# Patient Record
Sex: Female | Born: 1945
Health system: Southern US, Community
[De-identification: ages and names within clinical notes are randomized; demographics above are authoritative.]

## PROBLEM LIST (undated history)

## (undated) DIAGNOSIS — IMO0002 Reserved for concepts with insufficient information to code with codable children: Secondary | ICD-10-CM

## (undated) DIAGNOSIS — K219 Gastro-esophageal reflux disease without esophagitis: Secondary | ICD-10-CM

## (undated) DIAGNOSIS — Z973 Presence of spectacles and contact lenses: Secondary | ICD-10-CM

## (undated) DIAGNOSIS — Z8719 Personal history of other diseases of the digestive system: Secondary | ICD-10-CM

## (undated) DIAGNOSIS — E785 Hyperlipidemia, unspecified: Secondary | ICD-10-CM

## (undated) HISTORY — PX: DILATION AND CURETTAGE OF UTERUS: SHX78

## (undated) HISTORY — PX: TUBAL LIGATION: SHX77

## (undated) HISTORY — PX: WISDOM TOOTH EXTRACTION: SHX21

## (undated) HISTORY — PX: COLONOSCOPY W/ POLYPECTOMY: SHX1380

---

## 1998-06-06 ENCOUNTER — Encounter: Admission: RE | Admit: 1998-06-06 | Discharge: 1998-06-06 | Payer: Self-pay | Admitting: *Deleted

## 1999-11-18 ENCOUNTER — Encounter: Payer: Self-pay | Admitting: *Deleted

## 1999-11-18 ENCOUNTER — Inpatient Hospital Stay (HOSPITAL_COMMUNITY): Admission: EM | Admit: 1999-11-18 | Discharge: 1999-11-19 | Payer: Self-pay | Admitting: *Deleted

## 1999-11-19 ENCOUNTER — Encounter: Payer: Self-pay | Admitting: Cardiology

## 2000-01-01 ENCOUNTER — Encounter: Admission: RE | Admit: 2000-01-01 | Discharge: 2000-01-01 | Payer: Self-pay | Admitting: Obstetrics and Gynecology

## 2000-01-01 ENCOUNTER — Encounter: Payer: Self-pay | Admitting: Obstetrics and Gynecology

## 2000-01-19 ENCOUNTER — Other Ambulatory Visit: Admission: RE | Admit: 2000-01-19 | Discharge: 2000-01-19 | Payer: Self-pay | Admitting: Obstetrics and Gynecology

## 2000-01-19 ENCOUNTER — Other Ambulatory Visit: Admission: RE | Admit: 2000-01-19 | Discharge: 2000-01-19 | Payer: Self-pay | Admitting: *Deleted

## 2000-11-14 ENCOUNTER — Emergency Department (HOSPITAL_COMMUNITY): Admission: EM | Admit: 2000-11-14 | Discharge: 2000-11-14 | Payer: Self-pay | Admitting: Emergency Medicine

## 2001-02-04 ENCOUNTER — Encounter: Payer: Self-pay | Admitting: Neurology

## 2001-02-04 ENCOUNTER — Encounter: Payer: Self-pay | Admitting: Emergency Medicine

## 2001-02-04 ENCOUNTER — Emergency Department (HOSPITAL_COMMUNITY): Admission: EM | Admit: 2001-02-04 | Discharge: 2001-02-04 | Payer: Self-pay | Admitting: Emergency Medicine

## 2001-06-27 ENCOUNTER — Other Ambulatory Visit: Admission: RE | Admit: 2001-06-27 | Discharge: 2001-06-27 | Payer: Self-pay | Admitting: *Deleted

## 2001-07-06 HISTORY — PX: CARDIAC CATHETERIZATION: SHX172

## 2001-07-25 ENCOUNTER — Encounter: Admission: RE | Admit: 2001-07-25 | Discharge: 2001-07-25 | Payer: Self-pay | Admitting: *Deleted

## 2001-07-25 ENCOUNTER — Encounter: Payer: Self-pay | Admitting: *Deleted

## 2001-08-25 ENCOUNTER — Encounter: Admission: RE | Admit: 2001-08-25 | Discharge: 2001-08-25 | Payer: Self-pay | Admitting: Internal Medicine

## 2001-08-25 ENCOUNTER — Encounter: Payer: Self-pay | Admitting: Internal Medicine

## 2001-10-14 ENCOUNTER — Ambulatory Visit (HOSPITAL_COMMUNITY): Admission: RE | Admit: 2001-10-14 | Discharge: 2001-10-14 | Payer: Self-pay | Admitting: Gastroenterology

## 2001-10-19 ENCOUNTER — Ambulatory Visit (HOSPITAL_COMMUNITY): Admission: RE | Admit: 2001-10-19 | Discharge: 2001-10-19 | Payer: Self-pay | Admitting: Gastroenterology

## 2002-03-14 ENCOUNTER — Encounter: Payer: Self-pay | Admitting: Emergency Medicine

## 2002-03-14 ENCOUNTER — Emergency Department (HOSPITAL_COMMUNITY): Admission: EM | Admit: 2002-03-14 | Discharge: 2002-03-14 | Payer: Self-pay | Admitting: Emergency Medicine

## 2002-03-15 ENCOUNTER — Inpatient Hospital Stay (HOSPITAL_COMMUNITY): Admission: AD | Admit: 2002-03-15 | Discharge: 2002-03-17 | Payer: Self-pay | Admitting: Cardiology

## 2002-03-15 ENCOUNTER — Encounter: Payer: Self-pay | Admitting: Cardiology

## 2002-08-02 ENCOUNTER — Other Ambulatory Visit: Admission: RE | Admit: 2002-08-02 | Discharge: 2002-08-02 | Payer: Self-pay | Admitting: *Deleted

## 2002-08-29 ENCOUNTER — Encounter: Admission: RE | Admit: 2002-08-29 | Discharge: 2002-08-29 | Payer: Self-pay | Admitting: Internal Medicine

## 2002-08-29 ENCOUNTER — Encounter: Payer: Self-pay | Admitting: Internal Medicine

## 2002-09-29 ENCOUNTER — Emergency Department (HOSPITAL_COMMUNITY): Admission: AD | Admit: 2002-09-29 | Discharge: 2002-09-29 | Payer: Self-pay | Admitting: Emergency Medicine

## 2002-09-29 ENCOUNTER — Encounter: Payer: Self-pay | Admitting: Emergency Medicine

## 2005-02-19 ENCOUNTER — Encounter: Admission: RE | Admit: 2005-02-19 | Discharge: 2005-02-19 | Payer: Self-pay | Admitting: Internal Medicine

## 2006-07-06 DIAGNOSIS — Z8719 Personal history of other diseases of the digestive system: Secondary | ICD-10-CM

## 2006-07-06 HISTORY — DX: Personal history of other diseases of the digestive system: Z87.19

## 2006-12-25 ENCOUNTER — Emergency Department (HOSPITAL_COMMUNITY): Admission: EM | Admit: 2006-12-25 | Discharge: 2006-12-25 | Payer: Self-pay | Admitting: Emergency Medicine

## 2007-01-03 ENCOUNTER — Encounter: Admission: RE | Admit: 2007-01-03 | Discharge: 2007-01-03 | Payer: Self-pay | Admitting: Internal Medicine

## 2007-01-14 ENCOUNTER — Inpatient Hospital Stay (HOSPITAL_COMMUNITY): Admission: EM | Admit: 2007-01-14 | Discharge: 2007-01-16 | Payer: Self-pay | Admitting: Emergency Medicine

## 2008-04-10 IMAGING — MG MM SCREEN MAMMOGRAM BILATERAL
4 series · 4 of 4 positions shown · non-contrast
Comparison: Prior studies.

DG SCREEN MAMMOGRAM BILATERAL
Bilateral CC and MLO view(s) were taken.

DIGITAL SCREENING MAMMOGRAM WITH CAD:

[R CC]
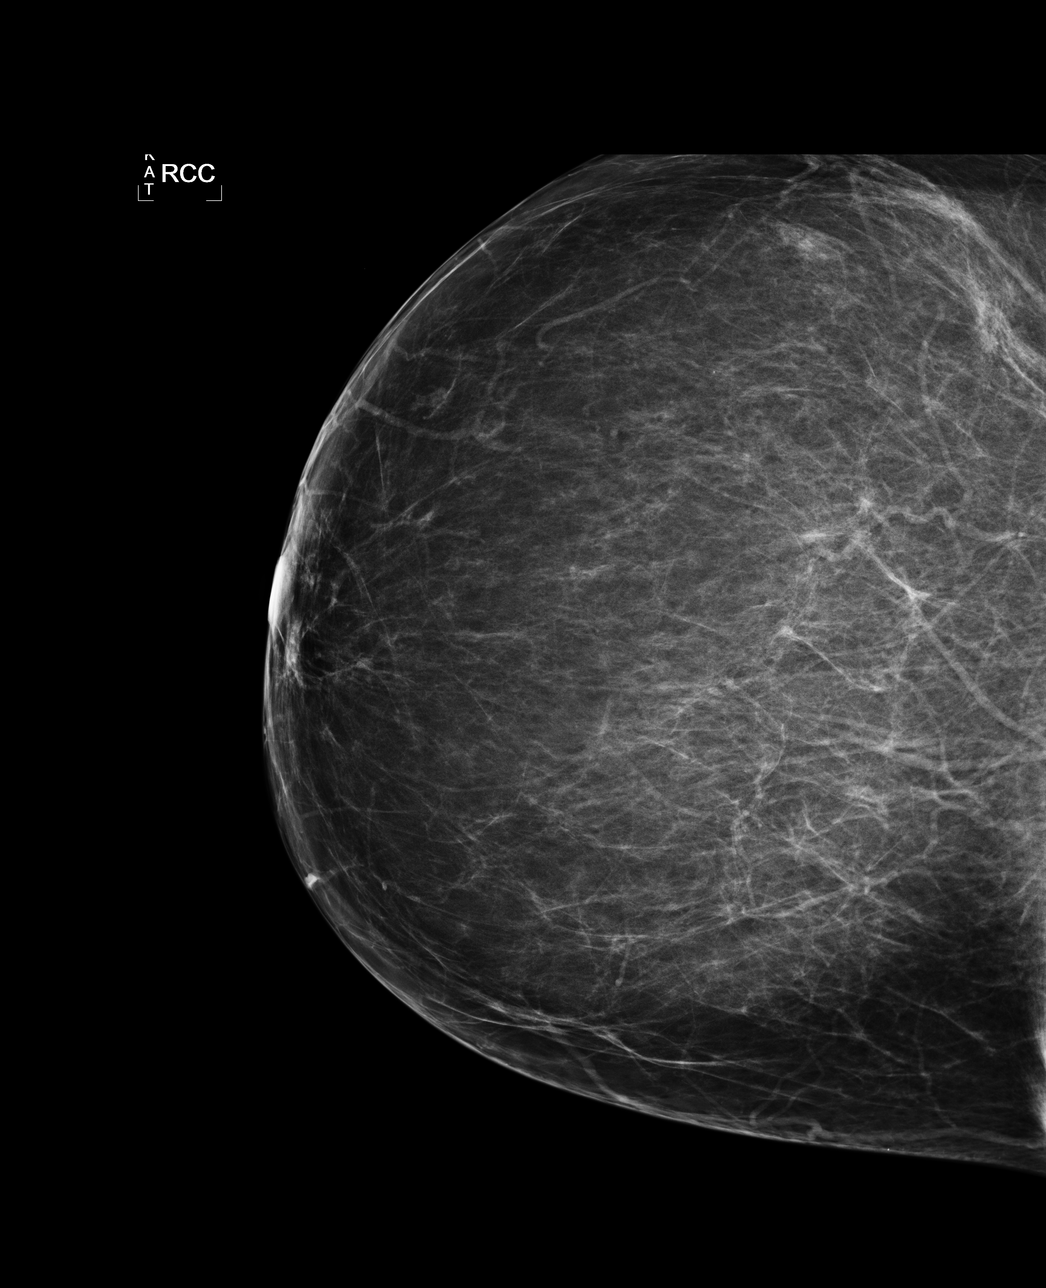

[L CC]
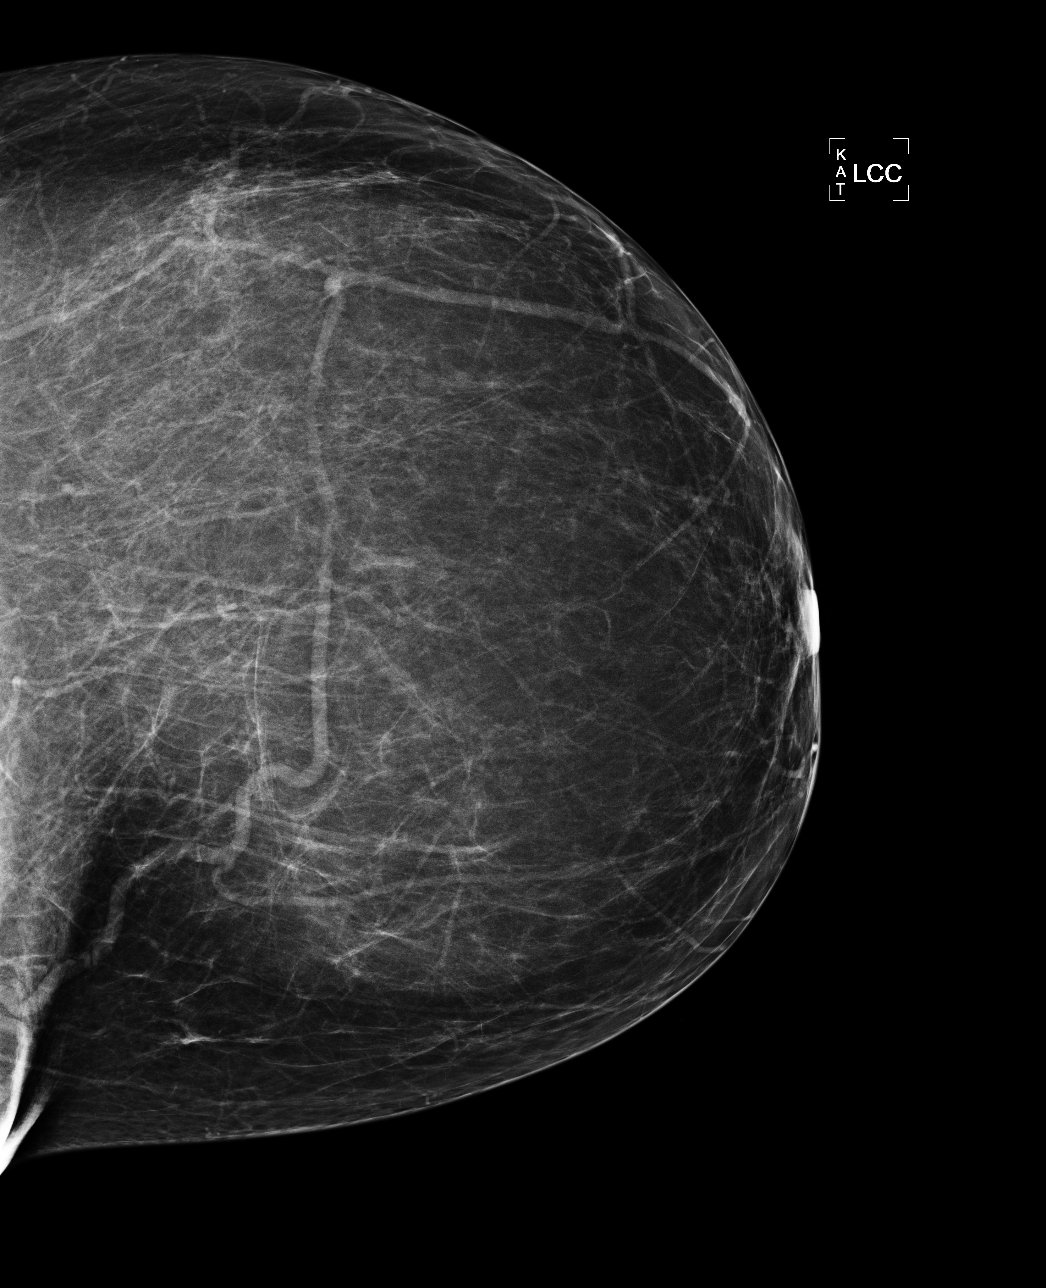

[L MLO]
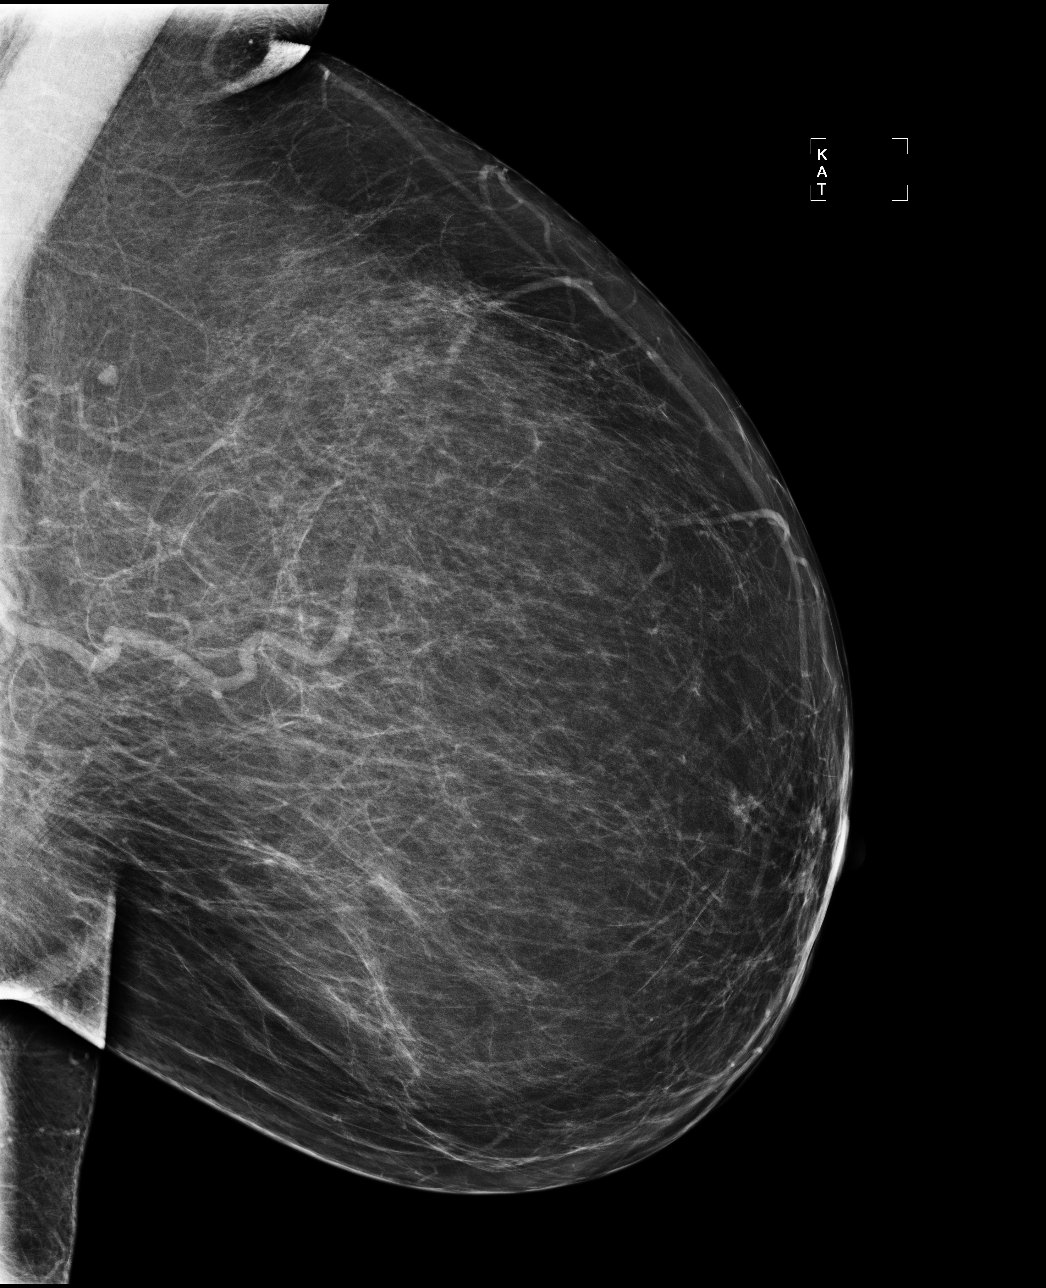

[R MLO]
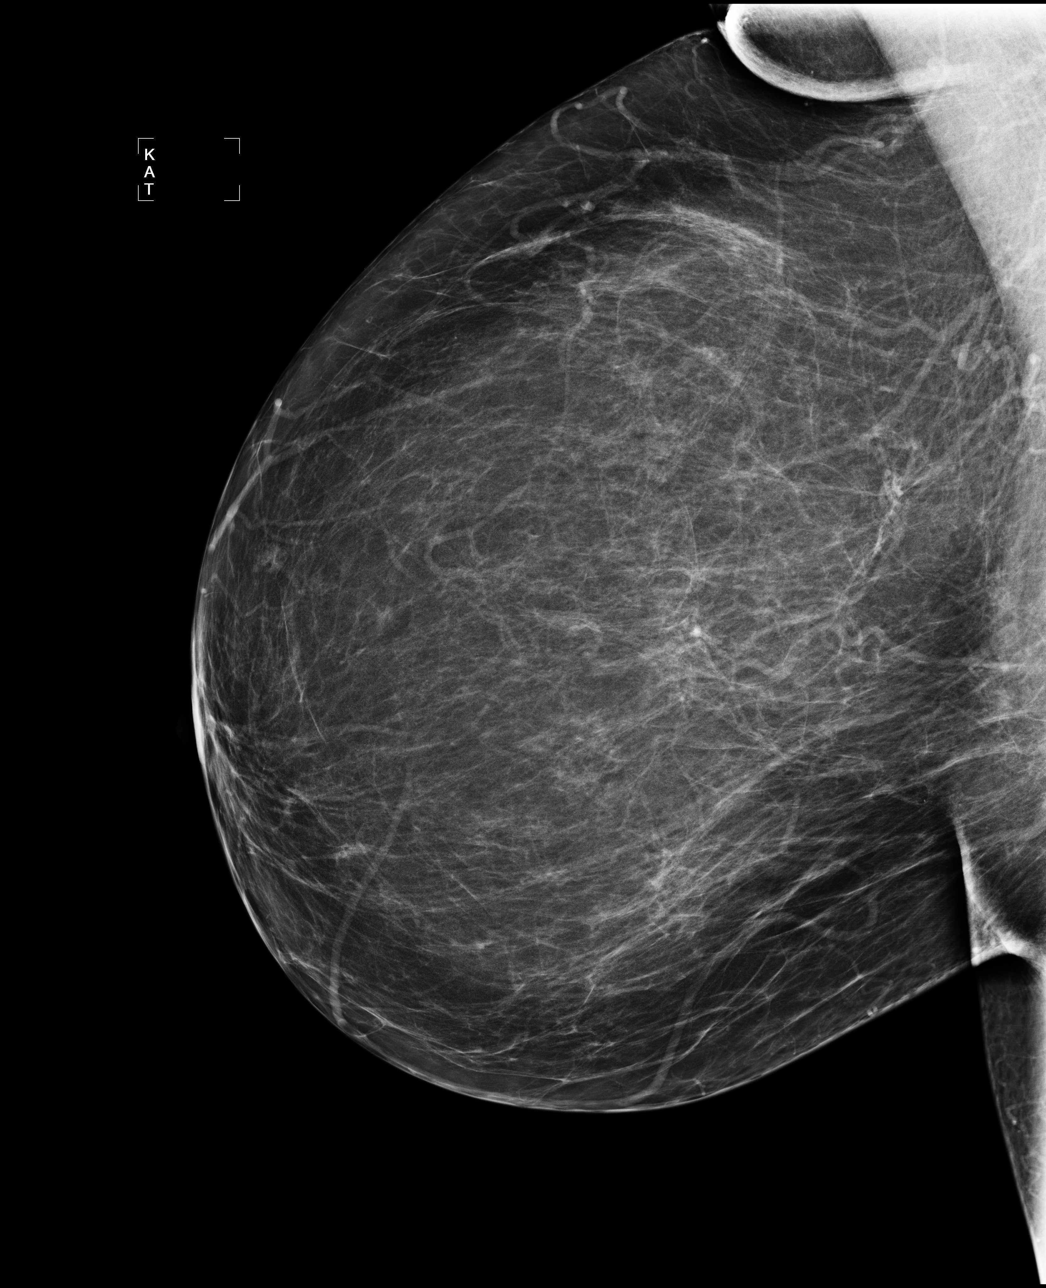

[4 of 4 positions shown; findings below may reference images not displayed]

The breast tissue is almost entirely fatty.  There is no dominant mass, architectural distortion or
calcification to suggest malignancy.
IMPRESSION: No mammographic evidence of malignancy.  Suggest yearly screening mammography.

ASSESSMENT: Negative - BI-RADS 1

Screening mammogram in 1 year.
ANALYZED BY COMPUTER AIDED DETECTION. , THIS PROCEDURE WAS A DIGITAL MAMMOGRAM.

## 2010-07-27 ENCOUNTER — Encounter: Payer: Self-pay | Admitting: *Deleted

## 2010-11-18 NOTE — Discharge Summary (Signed)
[name], [name]            ACCOUNT NO.:  [id number]   MEDICAL RECORD NO.:  [id number]          PATIENT TYPE:  INP   LOCATION:  4713                         FACILITY:  [facility]   PHYSICIAN:  Larina Earthly, M.D.        DATE OF BIRTH:  [birth year]   DATE OF ADMISSION:  01/14/2007  DATE OF DISCHARGE:  01/16/2007                               DISCHARGE SUMMARY   DISCHARGE DIAGNOSIS:  Gastrointestinal bleed status post snare  polypectomy complicated by syncope and bright red blood per rectum, now  clinically improved and hemodynamically stable requiring 1 unit of  packed red blood cell transfusion.   SECONDARY DIAGNOSES:  1. Osteoarthritis of the knee and ankles.  2. Depression.  3. Postmenopausal.  4. Systolic murmur at the left sternal border.  5. Gastroesophageal reflux disease.  6. Probable fibromyalgia.  7. History of known internal hemorrhoids and colon polyps.  8. Degenerative disk disease of the C-spine and L-spine.  9. Impaired fasting glucose.  10.Hyperlipidemia.   DISCHARGE MEDICATIONS:  1. Nexium 40 mg each day.  2. Aspirin 81 mg which will be restarted at the discretion of follow-      up visit on January 20, 2007.  3. Lexapro 5 mg each day.  4. Crestor 10 mg each day.  However, patient states she takes a 20 mg      pill each day.  5. Caltrate with D one pill daily.  6. Nu-Iron 150 mg p.o. b.i.d.   Patient has been instructed to call Dr. Felipa Eth or Dr. Kinnie Scales if she has  any recurrent syncope, episodes of GI bleeding, nausea or abdominal  discomfort and she is to call Dr. Vicente Males office at [phone] for an  appointment on January 19, 2007, at which time she will need a repeat CBC  and evaluation of her hemodynamic status.  She has been instructed to  avoid nuts and seeds and to increase her activity slowly and possibly  restart work on January 20, 2007.  She may need a note for her absence from  work.   HISTORY OF PRESENT ILLNESS:  Please see the history and physical  dictated  by Dr. Rodrigo Ran, for extensive details, however, this is a  very pleasant 65 year old African American female with a past medical  history as listed above who recently underwent colonoscopy by Dr. Ritta Slot on January 10, 2007, with snare polypectomy.  She did well for  approximately 48 hours, however, after that she developed syncope x2  with bright red blood per rectum.  She continued to feel weak and sought  medical assistance and was found to have a hemoglobin of 8.7.  She was  admitted for further care.  Please note that her baseline hemoglobin was  significantly higher, however, I do not have the actual number in front  of me.   HOSPITAL COURSE:  The patient was admitted, provided IV fluids, was  given normal saline intravenously and she had serial CBCs checked every  six hours.  Her hemoglobin did drop to 7.5 after which she got 1 unit of  packed red blood cell  transfusion on the second day of admission after  which her hemoglobin rose from 7.5-9.  Subsequent CBCs revealed an  increased to 9.7 and then decrease to 9.3 and 8.6 subsequently.  The  patient was totally asymptomatic except for one bowel movement with a  scant amount of blood per her report which was brown in color.  She has  been ambulating the halls with no difficulty.  She has remained  hemodynamically stable.  She reports no nausea or vomiting and has  tolerated a full diet.  She denies any abdominal pain and she was deemed  appropriate for discharge on January 16, 2007, with close follow-up.   OTHER PERTINENT LABS:  BMET with a sodium of 138, potassium 3.7, BUN 10,  creatinine 0.78 and glucose of 115.   Other medical issues such as her osteoarthritis, depression and  fibromyalgia remained stable throughout this hospitalization and she was  deemed appropriate to discharge on January 16, 2007.      Larina Earthly, M.D.  Electronically Signed     RA/MEDQ  D:  01/16/2007  T:  01/17/2007  Job:  [id number]   cc:    Griffith Citron, M.D.

## 2010-11-18 NOTE — H&P (Signed)
[name], [name]            ACCOUNT NO.:  [id number]   MEDICAL RECORD NO.:  [id number]          PATIENT TYPE:  INP   LOCATION:  4713                         FACILITY:  [facility]   PHYSICIAN:  Mark A. Perini, M.D.   DATE OF BIRTH:  13-Sep-1945   DATE OF ADMISSION:  01/14/2007  DATE OF DISCHARGE:                              HISTORY & PHYSICAL   CHIEF COMPLAINT:  Gastrointestinal bleeding.   HISTORY OF PRESENT ILLNESS:  Diane Dougherty is a very pleasant 65-year-  old female with a past medical history as listed below.  She underwent  colonoscopy on January 10, 2007, and had a snare polypectomy of a colon  polyp.  She did well after this until January 12, 2007, in he evening when  she developed syncope x2 and notices bright red blood per rectum and  maroon stool.  She continued to feel weak and had several episodes of  dark and maroon colored stool and was found today to have a hemoglobin  of 8.7.  She is admitted for further care.  She states she has had no  bloody stool since yesterday at this point.   PAST MEDICAL HISTORY:  1. Osteoarthritis of the knees and ankles.  2. Depression.  3. Post menopausal age 1.  4. Systolic murmur at the left sternal border.  5. Negative workup with a normal cardiac catheterization in 2003 for      chest pains.  6. Gastroesophageal reflux disease.  7. Small uterine fibroid.  8. Probable fibromyalgia in 2003.  9. Known internal hemorrhoids and a past history of colon polyps.  10.Fibromyalgia.  11.Degenerative disk disease of the C-spine and L-spine.  12.Empiric fasting glucose.  13.Hyperlipidemia.   ALLERGIES:  MORPHINE AND VICODIN.  She is intolerant to these.   CURRENT MEDICATIONS:  1. Nexium 40 mg daily.  2. Aspirin 81 mg, which she has been off this week.  3. Lexapro 5 mg daily.  4. Crestor 10 mg daily.  5. Caltrate with D one pill daily.   SOCIAL HISTORY:  She has been married since 1989.  She had a daughter  who was killed and she has  been raising her grandson.  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 There is no tobacco  history.  No alcohol history.  No drug use history.   FAMILY HISTORY:  Father is living and has diabetes.  Mother died at age  75 of throat cancer and colon cancer.  She has a brother who has had  colon polyps and another brother who died at age 52 months.   REVIEW OF SYSTEMS:  As per the history of present illness.  The patient  denies any fevers, chills or shortness of breath.  She has felt weak and  washed out.  She has had some mild chest discomfort yesterday, but none  currently.   PHYSICAL EXAMINATION:  VITAL SIGNS:  Temperature 97.8, blood pressure  131/80, pulse 68, respirations 18, 99% saturation on room air.  GENERAL:  She is in no acute distress.  She has no significant pallor or  icterus.  CHEST:  Clear to auscultation bilaterally.  HEART:  Regular rate and rhythm with a 1-2/6 systolic murmur heard at  the left and right sternal border in systole .  ABDOMEN:  Soft and nontender with normoactive bowel sounds and no  masses.  There is no peripheral edema.  NEUROLOGICAL:  Intact.   LABORATORY DATA:  White count is 8.2, hemoglobin 9.9, platelet 247,000.  There are 64% segs, 27% lymphocytes, 8% monocytes.  INR is 1.0, PTT 32.  Sodium 138, potassium 3.7, chloride 106, CO2 24, BUN 10, creatinine  0.78, glucose 115, GFR is greater than 60, calcium 9.2.  She has an  active type and screen.  Urinalysis reveals many squamous cells, too  numerous to count white cells, 3-6 red cells and a few bacteria.  Urinalysis is negative except large leukocytes on the dipstick.   ASSESSMENT/PLAN:  Lower gastrointestinal bleed secondary to polypectomy.  Hopefully, the patient has actually stopped the bleeding at this point.  We will admit and give low-dose IV normal saline.  We will check serial  CBC's with a hemoglobin check every 6 hours.  If she drops less than 8.2  or so, we would  consider transfusing with packed red cells.  We will  treat with oral iron. She is on a clear liquid diet currently.  If she  is stable, we will advance this tomorrow.  She is a full-code status.  We will continue her Nexium and Lexapro.  We will place her Crestor on  hold for the time being, and of course, we will hold her aspirin.           ______________________________  Redge Gainer. Waynard Edwards, M.D.     MAP/MEDQ  D:  01/14/2007  T:  01/14/2007  Job:  [id number]   cc:   Griffith Citron, M.D.

## 2010-11-21 NOTE — Cardiovascular Report (Signed)
   NAME:  Diane Dougherty, Diane Dougherty                 ACCOUNT NO.:  [id number]   MEDICAL RECORD NO.:  [id number]                   PATIENT TYPE:  INP   LOCATION:  2004                                 FACILITY:  [facility]   PHYSICIAN:  Colleen Can. Deborah Chalk, M.D.            DATE OF BIRTH:  [birth year]   DATE OF PROCEDURE:  03/17/2002  DATE OF DISCHARGE:                              CARDIAC CATHETERIZATION   HISTORY:  The patient is a 65 year old black female who presents with  atypical chest pain that has been persistent recurrent.  She is referred for  cardiac catheterization and coronary arteriograms.   PROCEDURES:  Left heart catheterization with selective coronary angiography,  left ventricular angiography with Perclose.   TYPE AND SITE OF ENTRY:  Percutaneous right femoral artery with Perclose.   CATHETERS:  The 6 French 4 curved Judkins right and left coronary catheters,  6 French pigtail ventriculographic catheter.   CONTRAST MATERIAL:  Omnipaque.   MEDICATIONS GIVEN PRIOR TO THE PROCEDURE:  Valium 10 mg p.o.   MEDICATIONS GIVEN DURING THE PROCEDURE:  Versed 2 mg IV, Ancef 1 g IV.   COMMENTS:  The patient tolerated the procedure well.   HEMODYNAMIC DATA:  The aortic pressure was 126/64,  LV is 126/13.  There was  no aortic valve gradient noted on pullback.   ANGIOGRAPHIC DATA:   LEFT VENTRICULOGRAM:  Left ventricular angiography was performed in the RAO  position. The overall cardiac size and silhouette are normal. Global  ejection fraction is 60%. There is no mitral regurgitation, intracardiac  calcification or intracardiac filling defect.   CORONARY ARTERIES:  The coronary arteries arise and distribute normal.  1. Right coronary artery:  The right coronary artery is a small dominant     vessel. It is normal.  2. Left main coronary artery:  The left main coronary artery is normal.  3. Left anterior descending:  The left anterior descending is a moderate     sized vessel with a  high diagonal system. It is normal.  4. Left circumflex:  The left circumflex continues as a large obtuse     marginal system extending into the inferior wall.  It is normal.    OVERALL IMPRESSION:  1. Normal left ventricular function.  2. Normal coronary arteries.                                                     Colleen Can. Deborah Chalk, M.D.    SNT/MEDQ  D:  03/17/2002  T:  03/19/2002  Job:  [id number]   cc:   Loraine Leriche A. Perini, M.D.

## 2010-11-21 NOTE — H&P (Signed)
NAME:  Diane Dougherty, Diane Dougherty                    ACCOUNT NO.:  [id number]   MEDICAL RECORD NO.:  [id number]                   PATIENT TYPE:   LOCATION:                                       FACILITY:   PHYSICIAN:  Colleen Can. Deborah Chalk, M.D.            DATE OF BIRTH:  1946/02/07   DATE OF ADMISSION:  03/15/2002  DATE OF DISCHARGE:                                HISTORY & PHYSICAL   HISTORY OF PRESENT ILLNESS:  The patient is a 65 year old female who was  seen in the emergency room on September 9, with left chest pain and left arm  numbness.  She had the onset of that associated with shortness of breath,  nausea, and feeling of being flush.  She basically had negative enzymes on  evaluation at that time and was subsequently referred to [facility] A. Perini,  M.D.'s office yesterday for evaluation.  She was felt to be nonacute at that  time, but she has continued to have a burning left chest pressure with  numbness of both her right and left arms.  She has had some intermittent  feeling of nauseousness and feeling weak, and inability to move  significantly.  Because of the persistence of this discomfort, she was  referred for evaluation and is admitted for management and plans for cardiac  catheterization.   She has a complex past medical history, but much of it dates back to the  death of her daughter in an automobile accident.  This was approximately  three to five years ago.  She cares for a grandchild age 56 at home with her  husband.  She has two grown sons.  She has had gastroesophageal reflux  disease and has diffuse aching in her joints.  The diagnosis of fibromyalgia  and recently been started on antidepressant.  About a year ago, she began  having recurrent episodes of epigastric and subxiphoid pain.  She has been  treated with proton pump inhibitors, but continues to have the discomfort.  Her cardiovascular risk factors include a father with a history of diabetes  and a history of  hypercholesterolemia.  There is no history of hypertension  or cigarette abuse.   FAMILY HISTORY:  Her father is living at age 94 with diabetes.  Her mother  died at age 46 of cancer of the throat.  There is one brother who died at a  very young age of pneumonia, one brother is alive and well, a daughter died  at age 54 in an auto accident.   PAST MEDICAL HISTORY:  Remarkable for a history of fibroid tumors removed in  1995, childbirth x3.   ALLERGIES:  MORPHINE SULFATE causes aching.   MEDICATIONS:  1. Nexium 40 mg a day.  2. Aspirin one a day.  3. Lexapro 10 mg a day, 1/2 tablet a day.  4. Recently started Tylenol p.r.n.   REVIEW OF SYSTEMS:  She has had a CT scan of her abdomen  which showed no  abnormalities.  She has not had a gallbladder ultrasound.  She has had  endoscopy by Dr. Kinnie Scales.  She has no pulmonary symptoms.  Her HDL  cholesterol has been satisfactory.  She has had multiple areas of point  tenderness and musculoskeletal pain.  She has been seen off and on for a  variety of complaints including neck pain and what is felt to be myofascial  pain.  She has had a history of palpitations as well.   PHYSICAL EXAMINATION:  VITAL SIGNS:  Weight 172, blood pressure 120/70  sitting, 110/90 standing, pulse 72.  HEENT:  Negative.  NECK:  Supple without bruits.  LUNGS:  Clear.  HEART:  Regular rate and rhythm.  There is no gallop or murmur.  There is  chest tenderness in numerous locations.  ABDOMEN:  Soft and nontender with normal bowel sounds.  RECTAL:  Not performed.  EXTREMITIES:  Without edema.  Peripheral pulses are intact.   Her EKG is basically normal.   IMPRESSION:  1. Atypical chest pain, persistent and associated with nausea.  2. History of gastroesophageal reflux disease.  3. Probable anxiety and depression.  4. Probable fibromyalgia.   DISCUSSION:  This is complex set of symptoms.  I think that in light of the  ongoing nature of it, cardiac  catheterization will be the best way to sort  that out.  Unfortunately, because of the persistence of chest pain as well  as the nausea and the weakness in an otherwise previous functional woman, I  think that it is probably in her best interest to admit her and stabilize  her prior to catheterization.  Will do a gallbladder ultrasound in addition  to the catheterization.                                                Colleen Can. Deborah Chalk, M.D.    SNT/MEDQ  D:  03/16/2002  T:  03/17/2002  Job:  [id number]

## 2010-11-21 NOTE — Discharge Summary (Signed)
NAME:  Diane Dougherty, Diane Dougherty                 ACCOUNT NO.:  [id number]   MEDICAL RECORD NO.:  [id number]                   PATIENT TYPE:  INP   LOCATION:  2004                                 FACILITY:  [facility]   PHYSICIAN:  Diane Dougherty, M.D.            DATE OF BIRTH:  01-15-1946   DATE OF ADMISSION:  03/15/2002  DATE OF DISCHARGE:  03/17/2002                                 DISCHARGE SUMMARY   DISCHARGE DIAGNOSES:  1. Chronic chest pain syndrome with subsequent elective cardiac     catheterization revealing normal coronary arteries and normal left     ventricular ejection fraction.  2. Significant anxiety and depression.  3. Fibromyalgia.   HISTORY OF PRESENT ILLNESS:  The patient is a 65 year old female who has had  some degree of chronic chest pain syndrome.  She has had several visits to  the emergency department as well as to her primary care Diane Dougherty.  Those  evaluations have yielded negative findings.  She was subsequently seen and  evaluated in our office on March 15, 2002, and was referred on for  admission due to the persistence of her discomfort.   Please see the dictated history and physical for further patient  presentation and profile.   LABORATORY DATA:  CBC was normal.  Chemistries initially showed a potassium  of 3.3 subsequently up to 4.2, blood sugars were elevated at 128 and 139  respectively.  All cardiac enzymes were negative.  Cholesterol 250, HDL 42,  LDL 186, triglycerides 111.  TSH normal at 0.498.  C-reactive protein in the  average risk range at 2.140.  Urine culture showed insignificant growth.   HOSPITAL COURSE:  The patient was admitted to the service of Diane Can.  Deborah Dougherty, M.D.  We initially proceeded on with gallbladder ultrasound which  was normal.  She continued to have intermittent episodes of chest burning,  despite her medical regimen and on March 17, 2002, we proceeded on with  cardiac catheterization.  Those findings are  as stated above.  She was  subsequently transferred back to telemetry and plans were made for her to be  discharged that evening with further evaluation to occur on an outpatient  basis.  It is felt that her chest pain is not cardiac in origin.   CONDITION ON DISCHARGE:  Stable.   DISCHARGE MEDICATIONS:  She will resume all of her previous medicines as she  was taking before.   FOLLOW UP:  Will have her follow up with Diane Dougherty, M.D., her primary  care Diane Dougherty, in approximately one week for further evaluation.  She will  see Diane Dougherty back on an as-needed basis.     Diane Dougherty, N.P.                 Diane Dougherty, M.D.    LCO/MEDQ  D:  03/21/2002  T:  03/22/2002  Job:  [id number]   cc:   Diane Dougherty,  M.D.  

## 2011-04-21 LAB — TYPE AND SCREEN
ABO/RH(D): O POS
Antibody Screen: NEGATIVE

## 2011-04-21 LAB — CBC
HCT: 24.8 — ABNORMAL LOW
HCT: 25.8 — ABNORMAL LOW
HCT: 27 — ABNORMAL LOW
HCT: 27.4 — ABNORMAL LOW
HCT: 28.6 — ABNORMAL LOW
Hemoglobin: 8.6 — ABNORMAL LOW
MCHC: 32.9
MCHC: 33.3
MCHC: 33.8
MCV: 86.8
MCV: 87.1
MCV: 87.4
MCV: 87.7
Platelets: 202
Platelets: 204
Platelets: 212
Platelets: 217
Platelets: 226
Platelets: 246
Platelets: 247
RBC: 2.59 — ABNORMAL LOW
RBC: 2.92 — ABNORMAL LOW
RDW: 13.6
RDW: 13.6
RDW: 13.6
RDW: 13.7
RDW: 13.7
RDW: 13.9
RDW: 14
WBC: 7
WBC: 8.2
WBC: 8.2
WBC: 8.6
WBC: 9.7

## 2011-04-21 LAB — I-STAT 8, (EC8 V) (CONVERTED LAB)
BUN: 10
Chloride: 106
HCT: 29 — ABNORMAL LOW
Potassium: 3.7
pCO2, Ven: 35.3 — ABNORMAL LOW
pH, Ven: 7.446 — ABNORMAL HIGH

## 2011-04-21 LAB — URINALYSIS, ROUTINE W REFLEX MICROSCOPIC
Glucose, UA: NEGATIVE
Hgb urine dipstick: NEGATIVE
pH: 7

## 2011-04-21 LAB — URINE MICROSCOPIC-ADD ON

## 2011-04-21 LAB — ABO/RH: ABO/RH(D): O POS

## 2011-04-21 LAB — POCT I-STAT CREATININE: Creatinine, Ser: 1

## 2011-04-21 LAB — BASIC METABOLIC PANEL
CO2: 24
Chloride: 106
GFR calc Af Amer: >60
Potassium: 3.7

## 2011-04-21 LAB — URINE CULTURE
Colony Count: 25000
Special Requests: NEGATIVE

## 2011-04-21 LAB — PREPARE RBC (CROSSMATCH)

## 2011-04-21 LAB — DIFFERENTIAL
Basophils Absolute: 0
Basophils Relative: 0
Eosinophils Absolute: 0.1
Eosinophils Relative: 1
Neutrophils Relative %: 64

## 2011-04-21 LAB — PROTIME-INR
INR: 1
Prothrombin Time: 13.5

## 2011-04-22 LAB — I-STAT 8, (EC8 V) (CONVERTED LAB)
Acid-base deficit: 1
Bicarbonate: 24.1 — ABNORMAL HIGH
HCT: 46
Hemoglobin: 15.6 — ABNORMAL HIGH
Operator id: 161631
Potassium: 3.4 — ABNORMAL LOW
Sodium: 139
TCO2: 25

## 2011-04-22 LAB — POCT CARDIAC MARKERS
CKMB, poc: <1 — ABNORMAL LOW
Myoglobin, poc: 43.9
Troponin i, poc: <0.05

## 2011-10-13 ENCOUNTER — Other Ambulatory Visit: Payer: Self-pay | Admitting: Gastroenterology

## 2011-10-16 ENCOUNTER — Ambulatory Visit
Admission: RE | Admit: 2011-10-16 | Discharge: 2011-10-16 | Disposition: A | Discharge status: Home / Self Care | Payer: Medicare Other | Source: Ambulatory Visit | Attending: Gastroenterology | Admitting: Gastroenterology

## 2011-10-16 MED ORDER — IOHEXOL 300 MG/ML  SOLN
100.0000 mL | Freq: Once | INTRAMUSCULAR | Status: AC | PRN
  Administered 2011-10-16: 100 mL via INTRAVENOUS

## 2012-05-05 ENCOUNTER — Other Ambulatory Visit: Payer: Self-pay | Admitting: Internal Medicine

## 2012-05-05 DIAGNOSIS — Z1231 Encounter for screening mammogram for malignant neoplasm of breast: Secondary | ICD-10-CM

## 2012-06-16 ENCOUNTER — Ambulatory Visit: Payer: Medicare Other

## 2012-06-22 ENCOUNTER — Ambulatory Visit: Payer: Medicare Other

## 2012-08-24 ENCOUNTER — Ambulatory Visit
Admission: RE | Admit: 2012-08-24 | Discharge: 2012-08-24 | Disposition: A | Discharge status: Home / Self Care | Payer: Medicare Other | Source: Ambulatory Visit | Attending: Internal Medicine | Admitting: Internal Medicine

## 2012-08-24 DIAGNOSIS — Z1231 Encounter for screening mammogram for malignant neoplasm of breast: Secondary | ICD-10-CM

## 2013-05-17 ENCOUNTER — Encounter (HOSPITAL_COMMUNITY): Payer: Self-pay | Admitting: Emergency Medicine

## 2013-05-17 ENCOUNTER — Emergency Department (HOSPITAL_COMMUNITY): Payer: Medicare Other

## 2013-05-17 ENCOUNTER — Emergency Department (HOSPITAL_COMMUNITY)
Admission: EM | Admit: 2013-05-17 | Discharge: 2013-05-17 | Disposition: A | Discharge status: Home / Self Care | Payer: Medicare Other | Attending: Emergency Medicine | Admitting: Emergency Medicine

## 2013-05-17 DIAGNOSIS — Z4802 Encounter for removal of sutures: Secondary | ICD-10-CM

## 2013-05-17 DIAGNOSIS — S4980XA Other specified injuries of shoulder and upper arm, unspecified arm, initial encounter: Secondary | ICD-10-CM | POA: Insufficient documentation

## 2013-05-17 DIAGNOSIS — M25511 Pain in right shoulder: Secondary | ICD-10-CM

## 2013-05-17 DIAGNOSIS — Y92009 Unspecified place in unspecified non-institutional (private) residence as the place of occurrence of the external cause: Secondary | ICD-10-CM | POA: Insufficient documentation

## 2013-05-17 DIAGNOSIS — S46909A Unspecified injury of unspecified muscle, fascia and tendon at shoulder and upper arm level, unspecified arm, initial encounter: Secondary | ICD-10-CM | POA: Insufficient documentation

## 2013-05-17 DIAGNOSIS — W19XXXA Unspecified fall, initial encounter: Secondary | ICD-10-CM | POA: Insufficient documentation

## 2013-05-17 DIAGNOSIS — Y9389 Activity, other specified: Secondary | ICD-10-CM | POA: Insufficient documentation

## 2013-05-17 MED ORDER — HYDROCODONE-ACETAMINOPHEN 5-325 MG PO TABS: 1.0000 | ORAL_TABLET | ORAL | Status: DC | PRN

## 2013-05-17 NOTE — ED Notes (Signed)
Tiburcio Pea, PA at bedside with patient.

## 2013-05-17 NOTE — ED Notes (Signed)
Pt c/o right shoulder pain x 3 weeks since fall; CMS intact but sts pain when moving; pt sts needs suture removal from left pinky hand

## 2013-05-17 NOTE — ED Provider Notes (Signed)
CSN: [id number]     Arrival date & time 05/17/13  0848 History  This chart was scribed for non-physician practitioner Arthor Captain, PA-C working with Geoffery Lyons, MD by Joaquin Music, ED Scribe. This patient was seen in room TR10C/TR10C and the patient's care was started at 11:29 AM .  Chief Complaint  Patient presents with  . Shoulder Pain  . Suture / Staple Removal   The history is provided by the patient. No language interpreter was used.   HPI Comments: Diane Dougherty is a 67 y.o. female who presents to the Emergency Department complaining of ongoing R shoulder pain that began 3 weeks ago. Pt states she was moving items at home and states she fell. She states she is able to move her shoulder but indicates she has pain with movement. She states she took OTC Ibuprofen last night with minimal relief. Pt reports being unable to get adequate amount of sleep due to pain. Pt denies having an orthopedic. She states she has a PCP. Pt works as a Financial planner.   Pt also has requested to have suture removed from her L 5th digit. She states she has had the sutures for 2 weeks. Pt reports bleeding to area that began yesterday. She states she is still having pain to palpation. Pt reports having missing skin to area.  History reviewed. No pertinent past medical history. History reviewed. No pertinent past surgical history. History reviewed. No pertinent family history. History  Substance Use Topics  . Smoking status: Never Smoker   . Smokeless tobacco: Not on file  . Alcohol Use: Yes     Comment: occ   OB History   Grav Para Term Preterm Abortions TAB SAB Ect Mult Living                 Review of Systems A complete 10 system review of systems was obtained and all systems are negative except as noted in the HPI and PMH.  Allergies  Review of patient's allergies indicates no known allergies.  Home Medications  No current outpatient prescriptions on  file.  Triage Vitals:BP 123/65  Pulse 73  Temp(Src) 98.2 F (36.8 C) (Oral)  Resp 18  SpO2 100%  Physical Exam  Nursing note and vitals reviewed. Constitutional: She is oriented to person, place, and time. She appears well-developed and well-nourished. No distress.  HENT:  Head: Normocephalic and atraumatic.  Eyes: EOM are normal.  Neck: Neck supple. No tracheal deviation present.  Cardiovascular: Normal rate.   Pulmonary/Chest: Effort normal. No respiratory distress.  Musculoskeletal: Normal range of motion.  Full passive of R shoulder. Pt is unable to abduct arm without assistance of ROM. Decreased strength.  TTP over the supraspinatus. No bruising or deformity.  Neurological: She is alert and oriented to person, place, and time.  Skin: Skin is warm and dry.  Laceration on finger appears to be properly healing. Area of denuding is still healing .No discharge. No signs of infection.  Psychiatric: She has a normal mood and affect. Her behavior is normal.    ED Course  Procedures  DIAGNOSTIC STUDIES: Oxygen Saturation is 100% on RA, normal by my interpretation.    COORDINATION OF CARE: 11:44 AM-Discussed treatment plan which includes D/C pt with arm sling, hydrocodone and refer pt to orthopedist. Pt agreed to plan.   Labs Review Labs Reviewed - No data to display Imaging Review Dg Shoulder Right  05/17/2013   CLINICAL DATA:  Status post fall 04/14/2013. Worsening right  shoulder pain.  EXAM: RIGHT SHOULDER - 2+ VIEW  COMPARISON:  None.  FINDINGS: The humerus is located and the acromioclavicular joint is intact. There is no fracture. Moderate to advanced acromioclavicular osteoarthritis is seen. Imaged lung parenchyma and ribs are unremarkable.  IMPRESSION: No acute finding.  Acromioclavicular osteoarthritis.   Electronically Signed   By: Drusilla Kanner M.D.   On: 05/17/2013 09:35    EKG Interpretation   None      SUTURE REMOVAL Performed by: Arthor Captain  Consent: Verbal consent obtained. Patient identity confirmed: provided demographic data Time out: Immediately prior to procedure a "time out" was called to verify the correct patient, procedure, equipment, support staff and site/side marked as required.  Location details: r pointer finger  Wound Appearance: clean  Sutures/Staples Removed: 7  Facility: sutures placed in this facility Patient tolerance: Patient tolerated the procedure well with no immediate complications.     MDM   1. Shoulder pain, right   2. Visit for suture removal    Patient shoulder injury consistent with likely Rotator cuff tear I did attempt to secure an appointment at [facility] ortho for the patient however was unable to as I did not have the patient's insurance policy number. 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 The patient appears reasonably screened and/or stabilized for discharge and I doubt any other medical condition or other [facility] requiring further screening, evaluation, or treatment in the ED at this time prior to discharge.  I personally performed the services described in this documentation, which was scribed in my presence. The recorded information has been reviewed and is accurate.    Arthor Captain, PA-C 05/18/13 1958

## 2013-05-18 ENCOUNTER — Other Ambulatory Visit: Payer: Self-pay | Admitting: Orthopaedic Surgery

## 2013-05-18 DIAGNOSIS — M25511 Pain in right shoulder: Secondary | ICD-10-CM

## 2013-05-19 NOTE — ED Provider Notes (Signed)
Medical screening examination/treatment/procedure(s) were performed by non-physician practitioner and as supervising physician I was immediately available for consultation/collaboration.     Yusuke Beza, MD 05/19/13 0714 

## 2013-05-20 ENCOUNTER — Ambulatory Visit
Admission: RE | Admit: 2013-05-20 | Discharge: 2013-05-20 | Disposition: A | Discharge status: Home / Self Care | Payer: Medicare Other | Source: Ambulatory Visit | Attending: Orthopaedic Surgery | Admitting: Orthopaedic Surgery

## 2013-05-20 ENCOUNTER — Other Ambulatory Visit: Payer: Medicare Other

## 2013-05-20 DIAGNOSIS — M25511 Pain in right shoulder: Secondary | ICD-10-CM

## 2013-05-23 ENCOUNTER — Other Ambulatory Visit: Payer: Medicare Other

## 2013-05-25 ENCOUNTER — Other Ambulatory Visit (HOSPITAL_BASED_OUTPATIENT_CLINIC_OR_DEPARTMENT_OTHER): Payer: Self-pay | Admitting: Orthopaedic Surgery

## 2013-07-05 ENCOUNTER — Encounter (HOSPITAL_BASED_OUTPATIENT_CLINIC_OR_DEPARTMENT_OTHER): Payer: Self-pay | Admitting: *Deleted

## 2013-07-05 NOTE — Progress Notes (Signed)
No labs needed

## 2013-07-12 ENCOUNTER — Encounter (HOSPITAL_BASED_OUTPATIENT_CLINIC_OR_DEPARTMENT_OTHER): Payer: Medicare Other | Admitting: Anesthesiology

## 2013-07-12 ENCOUNTER — Encounter (HOSPITAL_BASED_OUTPATIENT_CLINIC_OR_DEPARTMENT_OTHER): Payer: Self-pay | Admitting: *Deleted

## 2013-07-12 ENCOUNTER — Ambulatory Visit (HOSPITAL_BASED_OUTPATIENT_CLINIC_OR_DEPARTMENT_OTHER): Payer: Medicare Other | Admitting: Anesthesiology

## 2013-07-12 ENCOUNTER — Ambulatory Visit (HOSPITAL_BASED_OUTPATIENT_CLINIC_OR_DEPARTMENT_OTHER)
Admission: RE | Admit: 2013-07-12 | Discharge: 2013-07-12 | Disposition: A | Discharge status: Home / Self Care | Payer: Medicare Other | Source: Ambulatory Visit | Attending: Orthopaedic Surgery | Admitting: Orthopaedic Surgery

## 2013-07-12 ENCOUNTER — Encounter (HOSPITAL_BASED_OUTPATIENT_CLINIC_OR_DEPARTMENT_OTHER)
Admission: RE | Disposition: A | Discharge status: Home / Self Care | Payer: Self-pay | Source: Ambulatory Visit | Attending: Orthopaedic Surgery

## 2013-07-12 DIAGNOSIS — M67919 Unspecified disorder of synovium and tendon, unspecified shoulder: Secondary | ICD-10-CM | POA: Insufficient documentation

## 2013-07-12 DIAGNOSIS — M249 Joint derangement, unspecified: Secondary | ICD-10-CM | POA: Insufficient documentation

## 2013-07-12 DIAGNOSIS — M25819 Other specified joint disorders, unspecified shoulder: Secondary | ICD-10-CM | POA: Insufficient documentation

## 2013-07-12 DIAGNOSIS — K219 Gastro-esophageal reflux disease without esophagitis: Secondary | ICD-10-CM | POA: Insufficient documentation

## 2013-07-12 DIAGNOSIS — M75101 Unspecified rotator cuff tear or rupture of right shoulder, not specified as traumatic: Secondary | ICD-10-CM

## 2013-07-12 DIAGNOSIS — M75100 Unspecified rotator cuff tear or rupture of unspecified shoulder, not specified as traumatic: Secondary | ICD-10-CM

## 2013-07-12 DIAGNOSIS — M719 Bursopathy, unspecified: Principal | ICD-10-CM | POA: Insufficient documentation

## 2013-07-12 DIAGNOSIS — Z7982 Long term (current) use of aspirin: Secondary | ICD-10-CM | POA: Insufficient documentation

## 2013-07-12 DIAGNOSIS — M7580 Other shoulder lesions, unspecified shoulder: Secondary | ICD-10-CM

## 2013-07-12 HISTORY — PX: SHOULDER ARTHROSCOPY WITH SUBACROMIAL DECOMPRESSION, ROTATOR CUFF REPAIR AND BICEP TENDON REPAIR: SHX5687

## 2013-07-12 HISTORY — DX: Personal history of other diseases of the digestive system: Z87.19

## 2013-07-12 HISTORY — DX: Hyperlipidemia, unspecified: E78.5

## 2013-07-12 HISTORY — DX: Presence of spectacles and contact lenses: Z97.3

## 2013-07-12 HISTORY — DX: Reserved for concepts with insufficient information to code with codable children: IMO0002

## 2013-07-12 HISTORY — DX: Gastro-esophageal reflux disease without esophagitis: K21.9

## 2013-07-12 LAB — POCT HEMOGLOBIN-HEMACUE: Hemoglobin: 12.5 g/dL (ref 12.0–15.0)

## 2013-07-12 SURGERY — SHOULDER ARTHROSCOPY WITH SUBACROMIAL DECOMPRESSION, ROTATOR CUFF REPAIR AND BICEP TENDON REPAIR
Anesthesia: General | Site: Shoulder | Laterality: Right

## 2013-07-12 MED ORDER — FENTANYL CITRATE 0.05 MG/ML IJ SOLN
INTRAMUSCULAR | Status: AC
  Filled 2013-07-12: qty 4

## 2013-07-12 MED ORDER — HYDROMORPHONE HCL PF 1 MG/ML IJ SOLN
0.2500 mg | INTRAMUSCULAR | Status: DC | PRN
  Administered 2013-07-12 (×3): 0.5 mg via INTRAVENOUS

## 2013-07-12 MED ORDER — BUPIVACAINE-EPINEPHRINE PF 0.5-1:200000 % IJ SOLN
INTRAMUSCULAR | Status: DC | PRN
  Administered 2013-07-12: 23 mL via PERINEURAL

## 2013-07-12 MED ORDER — HYDROMORPHONE HCL PF 1 MG/ML IJ SOLN
INTRAMUSCULAR | Status: AC
  Filled 2013-07-12: qty 1

## 2013-07-12 MED ORDER — SUCCINYLCHOLINE CHLORIDE 20 MG/ML IJ SOLN
INTRAMUSCULAR | Status: DC | PRN
  Administered 2013-07-12: 100 mg via INTRAVENOUS

## 2013-07-12 MED ORDER — FENTANYL CITRATE 0.05 MG/ML IJ SOLN
50.0000 ug | INTRAMUSCULAR | Status: DC | PRN
  Administered 2013-07-12: 100 ug via INTRAVENOUS

## 2013-07-12 MED ORDER — LIDOCAINE HCL (CARDIAC) 20 MG/ML IV SOLN
INTRAVENOUS | Status: DC | PRN
  Administered 2013-07-12: 100 mg via INTRAVENOUS

## 2013-07-12 MED ORDER — MIDAZOLAM HCL 2 MG/2ML IJ SOLN
1.0000 mg | INTRAMUSCULAR | Status: DC | PRN
  Administered 2013-07-12: 2 mg via INTRAVENOUS

## 2013-07-12 MED ORDER — LACTATED RINGERS IV SOLN
INTRAVENOUS | Status: DC
  Administered 2013-07-12 (×2): via INTRAVENOUS

## 2013-07-12 MED ORDER — FENTANYL CITRATE 0.05 MG/ML IJ SOLN
INTRAMUSCULAR | Status: AC
  Filled 2013-07-12: qty 2

## 2013-07-12 MED ORDER — ONDANSETRON HCL 4 MG/2ML IJ SOLN: 4.0000 mg | Freq: Once | INTRAMUSCULAR | Status: DC | PRN

## 2013-07-12 MED ORDER — OXYCODONE HCL 5 MG PO TABS: 5.0000 mg | ORAL_TABLET | Freq: Once | ORAL | Status: DC | PRN

## 2013-07-12 MED ORDER — CEFAZOLIN SODIUM-DEXTROSE 2-3 GM-% IV SOLR
2.0000 g | INTRAVENOUS | Status: AC
  Administered 2013-07-12: 2 g via INTRAVENOUS

## 2013-07-12 MED ORDER — OXYCODONE HCL 5 MG/5ML PO SOLN: 5.0000 mg | Freq: Once | ORAL | Status: DC | PRN

## 2013-07-12 MED ORDER — EPHEDRINE SULFATE 50 MG/ML IJ SOLN
INTRAMUSCULAR | Status: DC | PRN
  Administered 2013-07-12: 10 mg via INTRAVENOUS

## 2013-07-12 MED ORDER — ONDANSETRON HCL 4 MG/2ML IJ SOLN
INTRAMUSCULAR | Status: DC | PRN
  Administered 2013-07-12: 4 mg via INTRAVENOUS

## 2013-07-12 MED ORDER — HYDROCODONE-ACETAMINOPHEN 5-325 MG PO TABS: 1.0000 | ORAL_TABLET | Freq: Four times a day (QID) | ORAL | Status: DC | PRN

## 2013-07-12 MED ORDER — SODIUM CHLORIDE 0.9 % IJ SOLN
INTRAMUSCULAR | Status: AC
  Filled 2013-07-12: qty 10

## 2013-07-12 MED ORDER — SODIUM CHLORIDE 0.9 % IR SOLN
Status: DC | PRN
  Administered 2013-07-12: 3000 mL

## 2013-07-12 MED ORDER — BUPIVACAINE-EPINEPHRINE PF 0.25-1:200000 % IJ SOLN
INTRAMUSCULAR | Status: AC
  Filled 2013-07-12: qty 30

## 2013-07-12 MED ORDER — DEXAMETHASONE SODIUM PHOSPHATE 4 MG/ML IJ SOLN
INTRAMUSCULAR | Status: DC | PRN
  Administered 2013-07-12: 10 mg via INTRAVENOUS

## 2013-07-12 MED ORDER — CEFAZOLIN SODIUM 1-5 GM-% IV SOLN
INTRAVENOUS | Status: AC
  Filled 2013-07-12: qty 100

## 2013-07-12 MED ORDER — FENTANYL CITRATE 0.05 MG/ML IJ SOLN
INTRAMUSCULAR | Status: DC | PRN
  Administered 2013-07-12: 50 ug via INTRAVENOUS

## 2013-07-12 MED ORDER — MIDAZOLAM HCL 2 MG/2ML IJ SOLN
INTRAMUSCULAR | Status: AC
  Filled 2013-07-12: qty 2

## 2013-07-12 MED ORDER — PROPOFOL 10 MG/ML IV BOLUS
INTRAVENOUS | Status: DC | PRN
  Administered 2013-07-12: 200 mg via INTRAVENOUS

## 2013-07-12 SURGICAL SUPPLY — 62 items
BENZOIN TINCTURE PRP APPL 2/3 (GAUZE/BANDAGES/DRESSINGS) IMPLANT
BLADE CUTTER GATOR 3.5 (BLADE) ×3 IMPLANT
BLADE GREAT WHITE 4.2 (BLADE) ×2 IMPLANT
BLADE GREAT WHITE 4.2MM (BLADE) ×1
BLADE SURG 15 STRL LF DISP TIS (BLADE) IMPLANT
BLADE SURG 15 STRL SS (BLADE)
BUR OVAL 4.0 (BURR) IMPLANT
BUR OVAL 6.0 (BURR) IMPLANT
CANISTER SUCT LVC 12 LTR MEDI- (MISCELLANEOUS) ×3 IMPLANT
CANNULA 5.75X71 LONG (CANNULA) ×3 IMPLANT
CANNULA TWIST IN 8.25X7CM (CANNULA) IMPLANT
CLOSURE WOUND 1/2 X4 (GAUZE/BANDAGES/DRESSINGS)
DECANTER SPIKE VIAL GLASS SM (MISCELLANEOUS) IMPLANT
DERMACARRIERS GRAFT 1 TO 1.5 (DISPOSABLE) ×3
DRAPE INCISE IOBAN 66X45 STRL (DRAPES) ×3 IMPLANT
DRAPE SHOULDER BEACH CHAIR (DRAPES) IMPLANT
DRAPE U 20/CS (DRAPES) ×3 IMPLANT
DRAPE U-SHAPE 47X51 STRL (DRAPES) ×6 IMPLANT
DRAPE U-SHAPE 76X120 STRL (DRAPES) ×6 IMPLANT
DURAPREP 26ML APPLICATOR (WOUND CARE) ×3 IMPLANT
ELECT REM PT RETURN 9FT ADLT (ELECTROSURGICAL) ×3
ELECTRODE REM PT RTRN 9FT ADLT (ELECTROSURGICAL) ×1 IMPLANT
GLOVE SURG SS PI 7.5 STRL IVOR (GLOVE) ×6 IMPLANT
GOWN STRL REUS W/ TWL LRG LVL3 (GOWN DISPOSABLE) ×1 IMPLANT
GOWN STRL REUS W/TWL LRG LVL3 (GOWN DISPOSABLE) ×2
GOWN STRL REUS W/TWL XL LVL3 (GOWN DISPOSABLE) ×3 IMPLANT
GRAFT DERMACARRIERS 1 TO 1.5 (DISPOSABLE) ×1 IMPLANT
IMMOBILIZER SHOULDER XLGE (ORTHOPEDIC SUPPLIES) IMPLANT
KIT SHOULDER TRACTION (DRAPES) ×3 IMPLANT
LASSO SUT 90 DEGREE (SUTURE) IMPLANT
NEEDLE SCORPION MULTI FIRE (NEEDLE) IMPLANT
PACK ARTHROSCOPY DSU (CUSTOM PROCEDURE TRAY) ×3 IMPLANT
PACK BASIN DAY SURGERY FS (CUSTOM PROCEDURE TRAY) ×3 IMPLANT
PAD ABD 8X10 STRL (GAUZE/BANDAGES/DRESSINGS) ×6 IMPLANT
SET ARTHROSCOPY TUBING (MISCELLANEOUS) ×2
SET ARTHROSCOPY TUBING LN (MISCELLANEOUS) ×1 IMPLANT
SHEET MEDIUM DRAPE 40X70 STRL (DRAPES) ×3 IMPLANT
SLEEVE SCD COMPRESS KNEE MED (MISCELLANEOUS) ×3 IMPLANT
SLING ARM FOAM STRAP LRG (SOFTGOODS) IMPLANT
SLING ARM FOAM STRAP MED (SOFTGOODS) ×3 IMPLANT
SLING ARM FOAM STRAP XLG (SOFTGOODS) IMPLANT
SLING ARM IMMOBILIZER LRG (SOFTGOODS) IMPLANT
SLING ARM IMMOBILIZER MED (SOFTGOODS) IMPLANT
SPONGE GAUZE 4X4 12PLY (GAUZE/BANDAGES/DRESSINGS) ×3 IMPLANT
STRIP CLOSURE SKIN 1/2X4 (GAUZE/BANDAGES/DRESSINGS) IMPLANT
SUT FIBERWIRE #2 38 T-5 BLUE (SUTURE)
SUT LASSO 45 DEGREE (SUTURE) IMPLANT
SUT LASSO 45 DEGREE LEFT (SUTURE) IMPLANT
SUT LASSO 45D RIGHT (SUTURE) IMPLANT
SUT MNCRL AB 4-0 PS2 18 (SUTURE) IMPLANT
SUT PDS AB 1 CT  36 (SUTURE)
SUT PDS AB 1 CT 36 (SUTURE) IMPLANT
SUT TIGER TAPE 7 IN WHITE (SUTURE) IMPLANT
SUT VIC AB 3-0 SH 27 (SUTURE)
SUT VIC AB 3-0 SH 27X BRD (SUTURE) IMPLANT
SUTURE FIBERWR #2 38 T-5 BLUE (SUTURE) IMPLANT
TOWEL OR 17X24 6PK STRL BLUE (TOWEL DISPOSABLE) ×3 IMPLANT
TOWEL OR NON WOVEN STRL DISP B (DISPOSABLE) ×3 IMPLANT
TUBE CONNECTING 20'X1/4 (TUBING)
TUBE CONNECTING 20X1/4 (TUBING) IMPLANT
WAND STAR VAC 90 (SURGICAL WAND) ×3 IMPLANT
WATER STERILE IRR 1000ML POUR (IV SOLUTION) ×3 IMPLANT

## 2013-07-12 NOTE — Op Note (Signed)
Date of surgery: 07/12/2038  Preoperative diagnosis:  1. Rotator cuff tear, right shoulder 2. Biceps tendon tear, right shoulder 3. Subacromial impingement, right shoulder  Postoperative diagnosis: Same  Procedure: 1. Right shoulder arthroscopy with extensive debridement 2. Right shoulder biceps tenotomy 3. Right shoulder subacromial decompression  Surgeon: Glee ArvinMichael Jabree Pernice, MD  Anesthesia: Gen.  Estimated blood loss: Minimal  Consultations: None next  Condition to PACU: Stable  Indications for procedure [name] is a 68 year old female who presents today for surgical treatment of the above-mentioned conditions. The risks benefits and alternatives to surgery were again reviewed with the patient and the patient elected to proceed with surgery. She had failed conservative treatment.  Description of procedure: The patient was identified in the preoperative holding area. The operative site and procedure were confirmed with the patient and marked by the surgeon. She was brought back to the operating room. She was placed supine on the table. General anesthesia was induced. She was then placed in the left lateral decubitus position and a beanbag. The right arm was suspended in a fishing pole mechanism. The right upper extremity was prepped and draped in standard sterile fashion. A timeout was performed. Pre-incisional antibiotics were given. The standard posterior portal to the shoulder was established. Using this portal we then established the anterior portal using an outside in technique. Once the 2 portals were established we performed the diagnostic arthroscopy portion of the shoulder. We visualized a very degenerative biceps tendon that was nearly self amputated. We then visualized the labrum which was also degenerative and had signs of inflammation. We then visualized the humeral head and the glenoid surface which showed a mild amount of arthritis. I then using oscillating shaver to debride the  labrum 360. I then also debrided the humeral head and glenoid surface. I then used a biter to perform a biceps tenotomy. The biceps anchor was then debrided down to a stable surface using an oscillating shaver. Once this was done we looked up at the undersurface of the rotator cuff. A massive rotator cuff tear was visualized and I was able to look into the subacromial space. I then established our portals into the subacromial space. The extensive amount of bursitis was debrided using oscillating shaver and ArthroCare wand. The ArthroCare wand was used to delineate the anterior lateral corner of the acromion. I performed a subacromial decompression using the oscillating shaver. I then visualized the large rotator cuff tear and debrided this tear. I used a tissue grabber to assess its mobility. The rotator cuff tear had limited mobility. The decision was made to not attempt a repair. I then further debrided the rotator cuff tear. Final pictures were taken. A sterile dressing was applied. The patient was placed in a sling. She will from anesthesia uneventfully and transferred to the PACU in stable condition.  Disposition: The patient will be weightbearing as tolerated to the right upper extremity. She will remain in the sling as long as she needs to for comfort. She can come out of the sling as tolerated. Plan on seeing her back in the office in one week for initiation of physical therapy.  Mayra ReelN. Michael Khamryn Calderone, MD [facility] [phone] 10:07 AM

## 2013-07-12 NOTE — Discharge Instructions (Signed)
1. Take off dressings tomorrow and put band aids on the 4 incisions. 2. May get incisions wet with shower tomorrow. 3. Wear sling for comfort and may discontinue use as tolerated  Regional Anesthesia Blocks  1. Numbness or the inability to move the "blocked" extremity may last from 3-48 hours after placement. The length of time depends on the medication injected and your individual response to the medication. If the numbness is not going away after 48 hours, call your surgeon.  2. The extremity that is blocked will need to be protected until the numbness is gone and the  Strength has returned. Because you cannot feel it, you will need to take extra care to avoid injury. Because it may be weak, you may have difficulty moving it or using it. You may not know what position it is in without looking at it while the block is in effect.  3. For blocks in the legs and feet, returning to weight bearing and walking needs to be done carefully. You will need to wait until the numbness is entirely gone and the strength has returned. You should be able to move your leg and foot normally before you try and bear weight or walk. You will need someone to be with you when you first try to ensure you do not fall and possibly risk injury.  4. Bruising and tenderness at the needle site are common side effects and will resolve in a few days.  5. Persistent numbness or new problems with movement should be communicated to the surgeon or the [facility] [phone])/ [facility] [phone]).   Post Anesthesia Home Care Instructions  Activity: Get plenty of rest for the remainder of the day. A responsible adult should stay with you for 24 hours following the procedure.  For the next 24 hours, DO NOT: -Drive a car -Advertising copywriterperate machinery -Drink alcoholic beverages -Take any medication unless instructed by your physician -Make any legal decisions or sign important papers.  Meals: Start with  liquid foods such as gelatin or soup. Progress to regular foods as tolerated. Avoid greasy, spicy, heavy foods. If nausea and/or vomiting occur, drink only clear liquids until the nausea and/or vomiting subsides. Call your physician if vomiting continues.  Special Instructions/Symptoms: Your throat may feel dry or sore from the anesthesia or the breathing tube placed in your throat during surgery. If this causes discomfort, gargle with warm salt water. The discomfort should disappear within 24 hours.

## 2013-07-12 NOTE — Anesthesia Postprocedure Evaluation (Signed)
  Anesthesia Post-op Note  Patient: Diane Dougherty  Procedure(s) Performed: Procedure(s): RIGHT SHOULDER ARTHROSCOPY WITH SUBACROMIAL DECOMPRESSION, DEBRIDEMENT, BICEPS TENOTOMY, POSSIBLE ROTATOR CUFF REPAIR (Right)  Patient Location: PACU  Anesthesia Type:GA combined with regional for post-op pain  Level of Consciousness: awake, alert  and oriented  Airway and Oxygen Therapy: Patient Spontanous Breathing  Post-op Pain: mild  Post-op Assessment: Post-op Vital signs reviewed  Post-op Vital Signs: Reviewed  Complications: No apparent anesthesia complications

## 2013-07-12 NOTE — Progress Notes (Signed)
Assisted Dr. Crews with right, ultrasound guided, interscalene  block. Side rails up, monitors on throughout procedure. See vital signs in flow sheet. Tolerated Procedure well. 

## 2013-07-12 NOTE — Transfer of Care (Signed)
Immediate Anesthesia Transfer of Care Note  Patient: Diane Dougherty  Procedure(s) Performed: Procedure(s): RIGHT SHOULDER ARTHROSCOPY WITH SUBACROMIAL DECOMPRESSION, DEBRIDEMENT, BICEPS TENOTOMY, POSSIBLE ROTATOR CUFF REPAIR (Right)  Patient Location: PACU  Anesthesia Type:General and GA combined with regional for post-op pain  Level of Consciousness: awake and alert   Airway & Oxygen Therapy: Patient Spontanous Breathing and Patient connected to face mask oxygen  Post-op Assessment: Report given to PACU RN and Post -op Vital signs reviewed and stable  Post vital signs: Reviewed and stable  Complications: No apparent anesthesia complications

## 2013-07-12 NOTE — Anesthesia Preprocedure Evaluation (Signed)
Anesthesia Evaluation  Patient identified by MRN, date of birth, ID band Patient awake    Reviewed: Allergy & Precautions, H&P , NPO status , Patient's Chart, lab work & pertinent test results  Airway Mallampati: I TM Distance: >3 FB Neck ROM: Full    Dental  (+) Teeth Intact, Dental Advisory Given and Partial Upper   Pulmonary  breath sounds clear to auscultation        Cardiovascular Rhythm:Regular Rate:Normal     Neuro/Psych    GI/Hepatic GERD-  Medicated and Controlled,  Endo/Other    Renal/GU      Musculoskeletal   Abdominal   Peds  Hematology   Anesthesia Other Findings   Reproductive/Obstetrics                           Anesthesia Physical Anesthesia Plan  ASA: I  Anesthesia Plan: General   Post-op Pain Management:    Induction: Intravenous  Airway Management Planned: Oral ETT  Additional Equipment:   Intra-op Plan:   Post-operative Plan: Extubation in OR  Informed Consent: I have reviewed the patients History and Physical, chart, labs and discussed the procedure including the risks, benefits and alternatives for the proposed anesthesia with the patient or authorized representative who has indicated his/her understanding and acceptance.   Dental advisory given  Plan Discussed with: CRNA, Anesthesiologist and Surgeon  Anesthesia Plan Comments:         Anesthesia Quick Evaluation

## 2013-07-12 NOTE — H&P (Signed)
PREOPERATIVE H&P  Chief Complaint: Right shoulder biceps tear, Rotator cuff tear, AC arthritis  HPI: Diane Dougherty is a 68 y.o. female who presents for surgical treatment of Right shoulder biceps tear, Rotator cuff tear, AC arthritis.  She denies any changes in medical history.  Past Medical History  Diagnosis Date  . Hyperlipemia   . GERD (gastroesophageal reflux disease)   . H/O: GI bleed 2008    lower  . DDD (degenerative disc disease)   . Wears glasses    Past Surgical History  Procedure Laterality Date  . Wisdom tooth extraction    . Colonoscopy w/ polypectomy    . Tubal ligation    . Dilation and curettage of uterus    . Cardiac catheterization  2003   History   Social History  . Marital Status: Legally Separated    Spouse Name: N/A    Number of Children: N/A  . Years of Education: N/A   Social History Main Topics  . Smoking status: Never Smoker   . Smokeless tobacco: None  . Alcohol Use: Yes     Comment: occ  . Drug Use: No  . Sexual Activity: None   Other Topics Concern  . None   Social History Narrative  . None   History reviewed. No pertinent family history. Allergies  Allergen Reactions  . Morphine And Related Other (See Comments)    hallucinations   Prior to Admission medications   Medication Sig Start Date End Date Taking? Authorizing Provider  aspirin EC 81 MG tablet Take 81 mg by mouth every other day.   Yes Historical Provider, MD  atorvastatin (LIPITOR) 40 MG tablet Take 40 mg by mouth daily.   Yes Historical Provider, MD  Omeprazole-Sodium Bicarbonate (ZEGERID) 20-1100 MG CAPS capsule Take 1 capsule by mouth daily before breakfast.   Yes Historical Provider, MD  HYDROcodone-acetaminophen (NORCO) 5-325 MG per tablet Take 1-2 tablets by mouth every 4 (four) hours as needed. 05/17/13   Arthor CaptainAbigail Harris, PA-C     Positive ROS: All other systems have been reviewed and were otherwise negative with the exception of those mentioned in the  HPI and as above.  Physical Exam: General: Alert, no acute distress Cardiovascular: No pedal edema Respiratory: No cyanosis, no use of accessory musculature GI: No organomegaly, abdomen is soft and non-tender Skin: No lesions in the area of chief complaint Neurologic: Sensation intact distally Psychiatric: Patient is competent for consent with normal mood and affect Lymphatic: No axillary or cervical lymphadenopathy  MUSCULOSKELETAL: RUE unchanged  Assessment: Right shoulder biceps tear, Rotator cuff tear, AC arthritis  Plan: Plan for Procedure(s): RIGHT SHOULDER ARTHROSCOPY WITH SUBACROMIAL DECOMPRESSION, DEBRIDEMENT, BICEPS TENOTOMY, POSSIBLE ROTATOR CUFF REPAIR  The risks benefits and alternatives were discussed with the patient including but not limited to the risks of nonoperative treatment, versus surgical intervention including infection, bleeding, nerve injury,  blood clots, cardiopulmonary complications, morbidity, mortality, among others, and they were willing to proceed.   Diane Dougherty, Diane Weissberg Michael, MD   07/12/2013 8:28 AM

## 2013-07-12 NOTE — Anesthesia Procedure Notes (Addendum)
Anesthesia Regional Block:  Interscalene brachial plexus block  Pre-Anesthetic Checklist: ,, timeout performed, Correct Patient, Correct Site, Correct Laterality, Correct Procedure, Correct Position, site marked, Risks and benefits discussed,  Surgical consent,  Pre-op evaluation,  At surgeon's request and post-op pain management  Laterality: Right and Upper  Prep: chloraprep       Needles:  Injection technique: Single-shot  Needle Type: Echogenic Needle     Needle Length: 5cm 5 cm Needle Gauge: 21 and 21 G    Additional Needles:  Procedures: ultrasound guided (picture in chart) Interscalene brachial plexus block Narrative:  Start time: 07/12/2013 8:05 AM End time: 07/12/2013 8:12 AM Injection made incrementally with aspirations every 5 mL.  Performed by: Personally  Anesthesiologist: Sheldon Silvanavid Ayane Delancey, MD   Procedure Name: Intubation Date/Time: 07/12/2013 8:38 AM Performed by: Caren MacadamARTER, APRIL W Pre-anesthesia Checklist: Patient identified, Emergency Drugs available, Suction available and Patient being monitored Patient Re-evaluated:Patient Re-evaluated prior to inductionOxygen Delivery Method: Circle System Utilized Preoxygenation: Pre-oxygenation with 100% oxygen Intubation Type: IV induction Ventilation: Mask ventilation without difficulty Laryngoscope Size: Miller and 2 Grade View: Grade I Tube type: Oral Tube size: 7.0 mm Number of attempts: 1 Airway Equipment and Method: stylet and oral airway Placement Confirmation: ETT inserted through vocal cords under direct vision,  positive ETCO2 and breath sounds checked- equal and bilateral Secured at: 21 cm Tube secured with: Tape Dental Injury: Teeth and Oropharynx as per pre-operative assessment

## 2013-07-13 ENCOUNTER — Encounter (HOSPITAL_BASED_OUTPATIENT_CLINIC_OR_DEPARTMENT_OTHER): Payer: Self-pay | Admitting: Orthopaedic Surgery

## 2013-10-09 ENCOUNTER — Other Ambulatory Visit: Payer: Self-pay

## 2013-10-09 DIAGNOSIS — Z1231 Encounter for screening mammogram for malignant neoplasm of breast: Secondary | ICD-10-CM

## 2014-02-15 ENCOUNTER — Other Ambulatory Visit: Payer: Self-pay | Admitting: Internal Medicine

## 2014-02-15 DIAGNOSIS — Z1231 Encounter for screening mammogram for malignant neoplasm of breast: Secondary | ICD-10-CM

## 2014-04-03 ENCOUNTER — Inpatient Hospital Stay: Admission: RE | Admit: 2014-04-03 | Payer: Medicare Other | Source: Ambulatory Visit

## 2015-01-01 ENCOUNTER — Other Ambulatory Visit: Payer: Self-pay | Admitting: Internal Medicine

## 2015-01-01 DIAGNOSIS — Z1231 Encounter for screening mammogram for malignant neoplasm of breast: Secondary | ICD-10-CM

## 2015-02-08 ENCOUNTER — Ambulatory Visit: Payer: Self-pay

## 2015-07-04 ENCOUNTER — Other Ambulatory Visit: Payer: Self-pay | Admitting: Internal Medicine

## 2015-07-04 DIAGNOSIS — R1032 Left lower quadrant pain: Secondary | ICD-10-CM

## 2015-07-11 ENCOUNTER — Other Ambulatory Visit: Payer: Self-pay

## 2015-07-12 ENCOUNTER — Other Ambulatory Visit: Payer: Self-pay

## 2015-07-17 ENCOUNTER — Inpatient Hospital Stay: Admission: RE | Admit: 2015-07-17 | Payer: Self-pay | Source: Ambulatory Visit

## 2015-07-26 ENCOUNTER — Other Ambulatory Visit: Payer: Self-pay

## 2015-07-31 ENCOUNTER — Ambulatory Visit
Admission: RE | Admit: 2015-07-31 | Discharge: 2015-07-31 | Disposition: A | Discharge status: Home / Self Care | Payer: PRIVATE HEALTH INSURANCE | Source: Ambulatory Visit | Attending: Internal Medicine | Admitting: Internal Medicine

## 2015-07-31 DIAGNOSIS — R1032 Left lower quadrant pain: Secondary | ICD-10-CM

## 2015-07-31 MED ORDER — IOPAMIDOL (ISOVUE-300) INJECTION 61%
100.0000 mL | Freq: Once | INTRAVENOUS | Status: AC | PRN
  Administered 2015-07-31: 100 mL via INTRAVENOUS

## 2016-04-03 ENCOUNTER — Other Ambulatory Visit: Payer: Self-pay | Admitting: Internal Medicine

## 2016-04-03 DIAGNOSIS — Z1231 Encounter for screening mammogram for malignant neoplasm of breast: Secondary | ICD-10-CM

## 2016-04-14 ENCOUNTER — Ambulatory Visit
Admission: RE | Admit: 2016-04-14 | Discharge: 2016-04-14 | Disposition: A | Discharge status: Home / Self Care | Payer: Medicare Other | Source: Ambulatory Visit | Attending: Internal Medicine | Admitting: Internal Medicine

## 2016-04-14 DIAGNOSIS — Z1231 Encounter for screening mammogram for malignant neoplasm of breast: Secondary | ICD-10-CM

## 2016-06-16 ENCOUNTER — Encounter (INDEPENDENT_AMBULATORY_CARE_PROVIDER_SITE_OTHER): Payer: Self-pay

## 2016-06-16 ENCOUNTER — Ambulatory Visit (INDEPENDENT_AMBULATORY_CARE_PROVIDER_SITE_OTHER): Payer: Medicare Other

## 2016-06-16 ENCOUNTER — Ambulatory Visit (INDEPENDENT_AMBULATORY_CARE_PROVIDER_SITE_OTHER): Payer: Medicare Other | Admitting: Orthopaedic Surgery

## 2016-06-16 ENCOUNTER — Encounter (INDEPENDENT_AMBULATORY_CARE_PROVIDER_SITE_OTHER): Payer: Self-pay | Admitting: Orthopaedic Surgery

## 2016-06-16 DIAGNOSIS — M12811 Other specific arthropathies, not elsewhere classified, right shoulder: Secondary | ICD-10-CM

## 2016-06-16 MED ORDER — TRAMADOL HCL 50 MG PO TABS: 50.0000 mg | ORAL_TABLET | Freq: Three times a day (TID) | ORAL | 0 refills | Status: DC | PRN

## 2016-06-16 MED ORDER — OXYCODONE HCL 5 MG PO TABS: 5.0000 mg | ORAL_TABLET | Freq: Two times a day (BID) | ORAL | 0 refills | Status: DC | PRN

## 2016-06-16 NOTE — Progress Notes (Addendum)
Office Visit Note   Patient: Len ChildsClaudene McManus Knick           Date of Birth: 06/18/1946           MRN: [id number] Visit Date: 06/16/2016              Requested by: Rodrigo RanMark Perini, MD [address] [place], [place] [id number] PCP: Ezequiel KayserPERINI,MARK A, MD   Assessment & Plan: Visit Diagnoses:  1. Rotator cuff arthropathy, right     Plan: Impression is right rotator cuff arthropathy without significant degenerative changes. I discussed with her that at the time I scoped her shoulder she had findings consistent with chronic massive rotator cuff tears. Unfortunately arthroscopy is not given her significant relief. She has also not been able to compensate for her rotator cuff tears. We will try an intra-articular injection with Dr. Alvester MorinNewton to see if this will give her some relief. We did discuss a permanent solution of reverse shoulder replacement but I don't think she is ready for this yet. If she ready then we will refer her to Dr. August Saucerean for further discussion  Follow-Up Instructions: Return if symptoms worsen or fail to improve.   Orders:  Orders Placed This Encounter  Procedures  . XR Shoulder Right   Meds ordered this encounter  Medications  . DISCONTD: traMADol (ULTRAM) 50 MG tablet    Sig: Take 1 tablet (50 mg total) by mouth 3 (three) times daily as needed.    Dispense:  30 tablet    Refill:  0  . DISCONTD: oxyCODONE (OXY IR/ROXICODONE) 5 MG immediate release tablet    Sig: Take 1 tablet (5 mg total) by mouth 2 (two) times daily as needed.    Dispense:  40 tablet    Refill:  0      Procedures: No procedures performed   Clinical Data: No additional findings.   Subjective: Chief Complaint  Patient presents with  . Right Shoulder - Pain    Patient is a 70 year old female who is well-known to me who underwent right shoulder scope and debridement about almost 3 years ago now. She continues to have mainly weakness and dysfunction when trying to raise her arm. Her pain is mainly  with activity and trying to raise her arm. Pain does not radiate. She does feel weakness cracking and popping. Denies any new injuries.    Review of Systems  Constitutional: Negative.   HENT: Negative.   Eyes: Negative.   Respiratory: Negative.   Cardiovascular: Negative.   Endocrine: Negative.   Musculoskeletal: Negative.   Neurological: Negative.   Hematological: Negative.   Psychiatric/Behavioral: Negative.   All other systems reviewed and are negative.    Objective: Vital Signs: There were no vitals taken for this visit.  Physical Exam  Constitutional: She is oriented to person, place, and time. She appears well-developed and well-nourished.  HENT:  Head: Atraumatic.  Eyes: EOM are normal.  Neck: Neck supple.  Cardiovascular: Intact distal pulses.   Pulmonary/Chest: Effort normal.  Abdominal: Soft.  Neurological: She is alert and oriented to person, place, and time.  Skin: Skin is warm. Capillary refill takes less than 2 seconds.  Psychiatric: She has a normal mood and affect. Her behavior is normal. Judgment and thought content normal.  Nursing note and vitals reviewed.   Ortho Exam Exam of the right shoulder shows a pseudo-paralytic shoulder. She has no focal motor sensory deficits. Passive range of motion is normal. Acromioclavicular joint is nontender. Negative cross adduction sign. Specialty  Comments:  No specialty comments available.  Imaging: Xr Shoulder Right  Result Date: 06/16/2016 Mild arthritis of the right shoulder    PMFS History: Patient Active Problem List   Diagnosis Date Noted  . Rotator cuff arthropathy, right 06/16/2016  . Rotator cuff tear 07/12/2013   Past Medical History:  Diagnosis Date  . DDD (degenerative disc disease)   . GERD (gastroesophageal reflux disease)   . H/O: GI bleed 2008   lower  . Hyperlipemia   . Wears glasses     History reviewed. No pertinent family history.  Past Surgical History:  Procedure Laterality  Date  . CARDIAC CATHETERIZATION  2003  . COLONOSCOPY W/ POLYPECTOMY    . DILATION AND CURETTAGE OF UTERUS    . SHOULDER ARTHROSCOPY WITH SUBACROMIAL DECOMPRESSION, ROTATOR CUFF REPAIR AND BICEP TENDON REPAIR Right 07/12/2013   Procedure: RIGHT SHOULDER ARTHROSCOPY WITH SUBACROMIAL DECOMPRESSION, DEBRIDEMENT, BICEPS TENOTOMY, POSSIBLE ROTATOR CUFF REPAIR;  Surgeon: Cheral AlmasNaiping Michael Roi Jafari, MD;  Location: [facility];  Service: Orthopedics;  Laterality: Right;  . TUBAL LIGATION    . WISDOM TOOTH EXTRACTION     Social History   Occupational History  . Not on file.   Social History Main Topics  . Smoking status: Never Smoker  . Smokeless tobacco: Not on file  . Alcohol use Yes     Comment: occ  . Drug use: No  . Sexual activity: Not on file

## 2016-06-18 ENCOUNTER — Telehealth (INDEPENDENT_AMBULATORY_CARE_PROVIDER_SITE_OTHER): Payer: Self-pay

## 2016-06-18 NOTE — Telephone Encounter (Signed)
PT script is ready for pick up at the front desk, called pt to let her know. She can take the script to which ever place she wants. Pt aware.

## 2016-06-18 NOTE — Telephone Encounter (Signed)
yes

## 2016-06-18 NOTE — Telephone Encounter (Signed)
Pt called and would like to know if you recommend her doing physical therapy and would like to know if she can apply ice or heat?

## 2016-10-27 ENCOUNTER — Other Ambulatory Visit: Payer: Self-pay | Admitting: Internal Medicine

## 2016-10-27 DIAGNOSIS — R102 Pelvic and perineal pain: Secondary | ICD-10-CM

## 2016-11-04 ENCOUNTER — Other Ambulatory Visit: Payer: Medicare Other

## 2016-11-24 ENCOUNTER — Ambulatory Visit
Admission: RE | Admit: 2016-11-24 | Discharge: 2016-11-24 | Disposition: A | Discharge status: Home / Self Care | Payer: Medicare Other | Source: Ambulatory Visit | Attending: Internal Medicine | Admitting: Internal Medicine

## 2016-11-24 DIAGNOSIS — R102 Pelvic and perineal pain: Secondary | ICD-10-CM

## 2017-08-10 ENCOUNTER — Ambulatory Visit: Payer: Medicare Other | Admitting: Sports Medicine

## 2017-08-11 ENCOUNTER — Ambulatory Visit: Payer: Medicare Other | Admitting: Podiatry

## 2017-08-11 ENCOUNTER — Encounter: Payer: Self-pay | Admitting: Podiatry

## 2017-08-11 VITALS — BP 135/83 | HR 63

## 2017-08-11 DIAGNOSIS — B351 Tinea unguium: Secondary | ICD-10-CM | POA: Diagnosis not present

## 2017-08-11 DIAGNOSIS — M79675 Pain in left toe(s): Secondary | ICD-10-CM

## 2017-08-11 NOTE — Progress Notes (Signed)
   Subjective:    Patient ID: Diane Dougherty, female    DOB: 01/21/1946, 72 y.o.   MRN: [id number]  HPI this patient presents the office stating that she is concerned about the big toenail on her left foot. She says she is scheduled for a important event this coming week and presents the office to have this evaluated to make sure there is no infection noted.  She says she has occasional soreness in her left big toe, but no severe pain noted through the nail.  Her nail also has a blackened area to the nail itself.  She denies any history of trauma or injury. She says she does not want any nail treatment today, which could jeopardize her event this weekend.  . Therefore, she presents the office to make sure there is no evidence of infection.    Review of Systems  All other systems reviewed and are negative.      Objective:   Physical Exam General Appearance  Alert, conversant and in no acute stress.  Vascular  Dorsalis pedis and posterior pulses are palpable  bilaterally.  Capillary return is within normal limits  bilaterally. Temperature is within normal limits  Bilaterally.  Neurologic  Senn-Weinstein monofilament wire test within normal limits  bilaterally. Muscle power within normal limits bilaterally.  Nails . Her left hallux nail is normal in appearance at the proximal third and has thick disfigured discolored nail at the distal two thirds left hallux.  There is no evidence of any drainage subungually.    Orthopedic  No limitations of motion of motion feet bilaterally.  No crepitus or effusions noted. HAV  B/L.  Skin  normotropic skin with no porokeratosis noted bilaterally.  No signs of infections or ulcers noted.          Assessment & Plan:  Onychomycosis  Left hallux nail  IE>  examined nail left hallux and it appears to have been injured.  The proximal third of the nail is normal, while the distal third is thick and disfigured.  No evidence of any redness, swelling or  drainage noted from the site of this nail.  Patient again reiterates she wants no treatment provided at today's visit.  I told this patient to reappoint herself ASAP. After her event and I will work on the thick nails of the left hallux toenail.   Helane GuntherGregory Trevionne Advani DPM

## 2017-12-21 ENCOUNTER — Other Ambulatory Visit: Payer: Self-pay | Admitting: Internal Medicine

## 2017-12-21 DIAGNOSIS — Z1231 Encounter for screening mammogram for malignant neoplasm of breast: Secondary | ICD-10-CM

## 2018-01-12 ENCOUNTER — Ambulatory Visit
Admission: RE | Admit: 2018-01-12 | Discharge: 2018-01-12 | Disposition: A | Discharge status: Home / Self Care | Payer: Medicare Other | Source: Ambulatory Visit | Attending: Internal Medicine | Admitting: Internal Medicine

## 2018-01-12 DIAGNOSIS — Z1231 Encounter for screening mammogram for malignant neoplasm of breast: Secondary | ICD-10-CM

## 2018-09-02 ENCOUNTER — Other Ambulatory Visit: Payer: Self-pay | Admitting: Internal Medicine

## 2018-09-02 DIAGNOSIS — N644 Mastodynia: Secondary | ICD-10-CM

## 2018-09-16 ENCOUNTER — Other Ambulatory Visit: Payer: Self-pay

## 2018-09-16 ENCOUNTER — Ambulatory Visit
Admission: RE | Admit: 2018-09-16 | Discharge: 2018-09-16 | Disposition: A | Discharge status: Home / Self Care | Payer: Medicare Other | Source: Ambulatory Visit | Attending: Internal Medicine | Admitting: Internal Medicine

## 2018-09-16 ENCOUNTER — Ambulatory Visit: Payer: Medicare Other

## 2018-09-16 DIAGNOSIS — N644 Mastodynia: Secondary | ICD-10-CM

## 2019-01-02 ENCOUNTER — Other Ambulatory Visit (HOSPITAL_COMMUNITY): Payer: Self-pay | Admitting: Internal Medicine

## 2019-01-02 ENCOUNTER — Telehealth (HOSPITAL_COMMUNITY): Payer: Self-pay | Admitting: Rehabilitation

## 2019-01-02 DIAGNOSIS — I739 Peripheral vascular disease, unspecified: Secondary | ICD-10-CM

## 2019-01-02 NOTE — Telephone Encounter (Signed)

## 2019-01-03 ENCOUNTER — Ambulatory Visit (HOSPITAL_COMMUNITY)
Admission: RE | Admit: 2019-01-03 | Discharge: 2019-01-03 | Disposition: A | Discharge status: Home / Self Care | Payer: Medicare Other | Source: Ambulatory Visit | Attending: Family | Admitting: Family

## 2019-01-03 ENCOUNTER — Other Ambulatory Visit: Payer: Self-pay

## 2019-01-03 DIAGNOSIS — I739 Peripheral vascular disease, unspecified: Secondary | ICD-10-CM | POA: Insufficient documentation

## 2019-01-04 ENCOUNTER — Encounter (HOSPITAL_COMMUNITY): Payer: Medicare Other

## 2019-12-18 ENCOUNTER — Other Ambulatory Visit: Payer: Self-pay | Admitting: Internal Medicine

## 2019-12-18 DIAGNOSIS — Z1231 Encounter for screening mammogram for malignant neoplasm of breast: Secondary | ICD-10-CM

## 2019-12-20 ENCOUNTER — Ambulatory Visit
Admission: RE | Admit: 2019-12-20 | Discharge: 2019-12-20 | Disposition: A | Discharge status: Home / Self Care | Payer: Medicare PPO | Source: Ambulatory Visit | Attending: Internal Medicine | Admitting: Internal Medicine

## 2019-12-20 ENCOUNTER — Other Ambulatory Visit: Payer: Self-pay

## 2019-12-20 DIAGNOSIS — Z1231 Encounter for screening mammogram for malignant neoplasm of breast: Secondary | ICD-10-CM

## 2020-02-15 DIAGNOSIS — E119 Type 2 diabetes mellitus without complications: Secondary | ICD-10-CM | POA: Diagnosis not present

## 2020-02-15 DIAGNOSIS — Z0000 Encounter for general adult medical examination without abnormal findings: Secondary | ICD-10-CM | POA: Diagnosis not present

## 2020-02-15 DIAGNOSIS — E7849 Other hyperlipidemia: Secondary | ICD-10-CM | POA: Diagnosis not present

## 2020-02-19 ENCOUNTER — Other Ambulatory Visit: Payer: Self-pay | Admitting: Internal Medicine

## 2020-02-19 DIAGNOSIS — E785 Hyperlipidemia, unspecified: Secondary | ICD-10-CM

## 2020-02-19 DIAGNOSIS — Z0000 Encounter for general adult medical examination without abnormal findings: Secondary | ICD-10-CM | POA: Diagnosis not present

## 2020-02-19 DIAGNOSIS — E1169 Type 2 diabetes mellitus with other specified complication: Secondary | ICD-10-CM | POA: Diagnosis not present

## 2020-02-19 DIAGNOSIS — R82998 Other abnormal findings in urine: Secondary | ICD-10-CM | POA: Diagnosis not present

## 2020-02-19 DIAGNOSIS — M5136 Other intervertebral disc degeneration, lumbar region: Secondary | ICD-10-CM | POA: Diagnosis not present

## 2020-02-19 DIAGNOSIS — D126 Benign neoplasm of colon, unspecified: Secondary | ICD-10-CM | POA: Diagnosis not present

## 2020-02-19 DIAGNOSIS — M179 Osteoarthritis of knee, unspecified: Secondary | ICD-10-CM | POA: Diagnosis not present

## 2020-02-19 DIAGNOSIS — M75100 Unspecified rotator cuff tear or rupture of unspecified shoulder, not specified as traumatic: Secondary | ICD-10-CM | POA: Diagnosis not present

## 2020-02-19 DIAGNOSIS — N939 Abnormal uterine and vaginal bleeding, unspecified: Secondary | ICD-10-CM | POA: Diagnosis not present

## 2020-02-20 DIAGNOSIS — Z1212 Encounter for screening for malignant neoplasm of rectum: Secondary | ICD-10-CM | POA: Diagnosis not present

## 2020-02-21 DIAGNOSIS — R030 Elevated blood-pressure reading, without diagnosis of hypertension: Secondary | ICD-10-CM | POA: Diagnosis not present

## 2020-02-22 DIAGNOSIS — R030 Elevated blood-pressure reading, without diagnosis of hypertension: Secondary | ICD-10-CM | POA: Diagnosis not present

## 2020-03-19 DIAGNOSIS — R7303 Prediabetes: Secondary | ICD-10-CM | POA: Diagnosis not present

## 2020-03-19 DIAGNOSIS — H2513 Age-related nuclear cataract, bilateral: Secondary | ICD-10-CM | POA: Diagnosis not present

## 2020-03-19 DIAGNOSIS — H401122 Primary open-angle glaucoma, left eye, moderate stage: Secondary | ICD-10-CM | POA: Diagnosis not present

## 2020-03-19 DIAGNOSIS — H401113 Primary open-angle glaucoma, right eye, severe stage: Secondary | ICD-10-CM | POA: Diagnosis not present

## 2020-03-27 DIAGNOSIS — R682 Dry mouth, unspecified: Secondary | ICD-10-CM | POA: Diagnosis not present

## 2020-03-27 DIAGNOSIS — M150 Primary generalized (osteo)arthritis: Secondary | ICD-10-CM | POA: Diagnosis not present

## 2020-03-27 DIAGNOSIS — E663 Overweight: Secondary | ICD-10-CM | POA: Diagnosis not present

## 2020-03-27 DIAGNOSIS — Z6826 Body mass index (BMI) 26.0-26.9, adult: Secondary | ICD-10-CM | POA: Diagnosis not present

## 2020-08-13 DIAGNOSIS — H401113 Primary open-angle glaucoma, right eye, severe stage: Secondary | ICD-10-CM | POA: Diagnosis not present

## 2020-08-13 DIAGNOSIS — H401122 Primary open-angle glaucoma, left eye, moderate stage: Secondary | ICD-10-CM | POA: Diagnosis not present

## 2020-08-21 DIAGNOSIS — E1169 Type 2 diabetes mellitus with other specified complication: Secondary | ICD-10-CM | POA: Diagnosis not present

## 2020-08-21 DIAGNOSIS — E785 Hyperlipidemia, unspecified: Secondary | ICD-10-CM | POA: Diagnosis not present

## 2020-08-21 DIAGNOSIS — K219 Gastro-esophageal reflux disease without esophagitis: Secondary | ICD-10-CM | POA: Diagnosis not present

## 2020-08-21 DIAGNOSIS — F329 Major depressive disorder, single episode, unspecified: Secondary | ICD-10-CM | POA: Diagnosis not present

## 2020-10-23 DIAGNOSIS — N898 Other specified noninflammatory disorders of vagina: Secondary | ICD-10-CM | POA: Diagnosis not present

## 2020-10-23 DIAGNOSIS — Z113 Encounter for screening for infections with a predominantly sexual mode of transmission: Secondary | ICD-10-CM | POA: Diagnosis not present

## 2020-11-13 DIAGNOSIS — N898 Other specified noninflammatory disorders of vagina: Secondary | ICD-10-CM | POA: Diagnosis not present

## 2020-11-13 DIAGNOSIS — N760 Acute vaginitis: Secondary | ICD-10-CM | POA: Diagnosis not present

## 2020-12-04 DIAGNOSIS — N898 Other specified noninflammatory disorders of vagina: Secondary | ICD-10-CM | POA: Diagnosis not present

## 2020-12-04 DIAGNOSIS — N760 Acute vaginitis: Secondary | ICD-10-CM | POA: Diagnosis not present

## 2021-01-02 DIAGNOSIS — N760 Acute vaginitis: Secondary | ICD-10-CM | POA: Diagnosis not present

## 2021-01-02 DIAGNOSIS — N898 Other specified noninflammatory disorders of vagina: Secondary | ICD-10-CM | POA: Diagnosis not present

## 2021-01-10 DIAGNOSIS — H401113 Primary open-angle glaucoma, right eye, severe stage: Secondary | ICD-10-CM | POA: Diagnosis not present

## 2021-01-15 DIAGNOSIS — N898 Other specified noninflammatory disorders of vagina: Secondary | ICD-10-CM | POA: Diagnosis not present

## 2021-01-23 ENCOUNTER — Other Ambulatory Visit: Payer: Self-pay | Admitting: Internal Medicine

## 2021-01-23 DIAGNOSIS — Z1231 Encounter for screening mammogram for malignant neoplasm of breast: Secondary | ICD-10-CM

## 2021-02-03 DIAGNOSIS — N898 Other specified noninflammatory disorders of vagina: Secondary | ICD-10-CM | POA: Diagnosis not present

## 2021-02-05 DIAGNOSIS — N898 Other specified noninflammatory disorders of vagina: Secondary | ICD-10-CM | POA: Diagnosis not present

## 2021-02-06 ENCOUNTER — Ambulatory Visit: Payer: Medicare PPO

## 2021-02-10 DIAGNOSIS — N859 Noninflammatory disorder of uterus, unspecified: Secondary | ICD-10-CM | POA: Diagnosis not present

## 2021-02-10 DIAGNOSIS — N898 Other specified noninflammatory disorders of vagina: Secondary | ICD-10-CM | POA: Diagnosis not present

## 2021-02-10 DIAGNOSIS — L03116 Cellulitis of left lower limb: Secondary | ICD-10-CM | POA: Diagnosis not present

## 2021-02-10 DIAGNOSIS — T63421A Toxic effect of venom of ants, accidental (unintentional), initial encounter: Secondary | ICD-10-CM | POA: Diagnosis not present

## 2021-02-13 ENCOUNTER — Ambulatory Visit: Payer: Medicare PPO

## 2021-02-14 DIAGNOSIS — L292 Pruritus vulvae: Secondary | ICD-10-CM | POA: Diagnosis not present

## 2021-02-14 DIAGNOSIS — N898 Other specified noninflammatory disorders of vagina: Secondary | ICD-10-CM | POA: Diagnosis not present

## 2021-03-12 DIAGNOSIS — R319 Hematuria, unspecified: Secondary | ICD-10-CM | POA: Diagnosis not present

## 2021-03-12 DIAGNOSIS — N950 Postmenopausal bleeding: Secondary | ICD-10-CM | POA: Diagnosis not present

## 2021-03-13 DIAGNOSIS — N950 Postmenopausal bleeding: Secondary | ICD-10-CM | POA: Diagnosis not present

## 2021-03-18 DIAGNOSIS — N898 Other specified noninflammatory disorders of vagina: Secondary | ICD-10-CM | POA: Diagnosis not present

## 2021-03-18 DIAGNOSIS — R9389 Abnormal findings on diagnostic imaging of other specified body structures: Secondary | ICD-10-CM | POA: Diagnosis not present

## 2021-03-25 DIAGNOSIS — E1169 Type 2 diabetes mellitus with other specified complication: Secondary | ICD-10-CM | POA: Diagnosis not present

## 2021-03-25 DIAGNOSIS — E785 Hyperlipidemia, unspecified: Secondary | ICD-10-CM | POA: Diagnosis not present

## 2021-04-01 ENCOUNTER — Other Ambulatory Visit: Payer: Self-pay | Admitting: Internal Medicine

## 2021-04-01 DIAGNOSIS — B373 Candidiasis of vulva and vagina: Secondary | ICD-10-CM | POA: Diagnosis not present

## 2021-04-01 DIAGNOSIS — N939 Abnormal uterine and vaginal bleeding, unspecified: Secondary | ICD-10-CM | POA: Diagnosis not present

## 2021-04-01 DIAGNOSIS — Z1331 Encounter for screening for depression: Secondary | ICD-10-CM | POA: Diagnosis not present

## 2021-04-01 DIAGNOSIS — M179 Osteoarthritis of knee, unspecified: Secondary | ICD-10-CM | POA: Diagnosis not present

## 2021-04-01 DIAGNOSIS — F329 Major depressive disorder, single episode, unspecified: Secondary | ICD-10-CM | POA: Diagnosis not present

## 2021-04-01 DIAGNOSIS — Z1389 Encounter for screening for other disorder: Secondary | ICD-10-CM | POA: Diagnosis not present

## 2021-04-01 DIAGNOSIS — Z23 Encounter for immunization: Secondary | ICD-10-CM | POA: Diagnosis not present

## 2021-04-01 DIAGNOSIS — R82998 Other abnormal findings in urine: Secondary | ICD-10-CM | POA: Diagnosis not present

## 2021-04-01 DIAGNOSIS — E785 Hyperlipidemia, unspecified: Secondary | ICD-10-CM

## 2021-04-01 DIAGNOSIS — E1169 Type 2 diabetes mellitus with other specified complication: Secondary | ICD-10-CM | POA: Diagnosis not present

## 2021-04-01 DIAGNOSIS — M5136 Other intervertebral disc degeneration, lumbar region: Secondary | ICD-10-CM | POA: Diagnosis not present

## 2021-04-01 DIAGNOSIS — Z0000 Encounter for general adult medical examination without abnormal findings: Secondary | ICD-10-CM | POA: Diagnosis not present

## 2021-04-01 DIAGNOSIS — E119 Type 2 diabetes mellitus without complications: Secondary | ICD-10-CM | POA: Diagnosis not present

## 2021-04-17 DIAGNOSIS — N760 Acute vaginitis: Secondary | ICD-10-CM | POA: Diagnosis not present

## 2021-04-17 DIAGNOSIS — N898 Other specified noninflammatory disorders of vagina: Secondary | ICD-10-CM | POA: Diagnosis not present

## 2021-05-05 DIAGNOSIS — M1990 Unspecified osteoarthritis, unspecified site: Secondary | ICD-10-CM | POA: Diagnosis not present

## 2021-05-05 DIAGNOSIS — Z7982 Long term (current) use of aspirin: Secondary | ICD-10-CM | POA: Diagnosis not present

## 2021-05-05 DIAGNOSIS — E785 Hyperlipidemia, unspecified: Secondary | ICD-10-CM | POA: Diagnosis not present

## 2021-05-05 DIAGNOSIS — R030 Elevated blood-pressure reading, without diagnosis of hypertension: Secondary | ICD-10-CM | POA: Diagnosis not present

## 2021-05-05 DIAGNOSIS — E663 Overweight: Secondary | ICD-10-CM | POA: Diagnosis not present

## 2021-05-05 DIAGNOSIS — Z6825 Body mass index (BMI) 25.0-25.9, adult: Secondary | ICD-10-CM | POA: Diagnosis not present

## 2021-05-05 DIAGNOSIS — Z833 Family history of diabetes mellitus: Secondary | ICD-10-CM | POA: Diagnosis not present

## 2021-05-05 DIAGNOSIS — H409 Unspecified glaucoma: Secondary | ICD-10-CM | POA: Diagnosis not present

## 2021-05-05 DIAGNOSIS — Z8249 Family history of ischemic heart disease and other diseases of the circulatory system: Secondary | ICD-10-CM | POA: Diagnosis not present

## 2021-05-21 DIAGNOSIS — N760 Acute vaginitis: Secondary | ICD-10-CM | POA: Diagnosis not present

## 2021-05-21 DIAGNOSIS — H5203 Hypermetropia, bilateral: Secondary | ICD-10-CM | POA: Diagnosis not present

## 2021-05-21 DIAGNOSIS — R7303 Prediabetes: Secondary | ICD-10-CM | POA: Diagnosis not present

## 2021-05-21 DIAGNOSIS — M545 Low back pain, unspecified: Secondary | ICD-10-CM | POA: Diagnosis not present

## 2021-05-21 DIAGNOSIS — H2513 Age-related nuclear cataract, bilateral: Secondary | ICD-10-CM | POA: Diagnosis not present

## 2021-05-21 DIAGNOSIS — H401113 Primary open-angle glaucoma, right eye, severe stage: Secondary | ICD-10-CM | POA: Diagnosis not present

## 2021-05-22 DIAGNOSIS — L659 Nonscarring hair loss, unspecified: Secondary | ICD-10-CM | POA: Diagnosis not present

## 2021-05-22 DIAGNOSIS — D649 Anemia, unspecified: Secondary | ICD-10-CM | POA: Diagnosis not present

## 2021-05-24 DIAGNOSIS — M25562 Pain in left knee: Secondary | ICD-10-CM | POA: Diagnosis not present

## 2021-05-24 DIAGNOSIS — M1712 Unilateral primary osteoarthritis, left knee: Secondary | ICD-10-CM | POA: Diagnosis not present

## 2021-05-24 DIAGNOSIS — M2392 Unspecified internal derangement of left knee: Secondary | ICD-10-CM | POA: Diagnosis not present

## 2021-11-13 ENCOUNTER — Ambulatory Visit: Payer: Medicare PPO

## 2021-11-20 ENCOUNTER — Ambulatory Visit
Admission: RE | Admit: 2021-11-20 | Discharge: 2021-11-20 | Disposition: A | Discharge status: Home / Self Care | Payer: Medicare PPO | Source: Ambulatory Visit | Attending: Internal Medicine | Admitting: Internal Medicine

## 2021-11-20 DIAGNOSIS — Z1231 Encounter for screening mammogram for malignant neoplasm of breast: Secondary | ICD-10-CM | POA: Diagnosis not present

## 2021-11-26 DIAGNOSIS — H401122 Primary open-angle glaucoma, left eye, moderate stage: Secondary | ICD-10-CM | POA: Diagnosis not present

## 2021-11-26 DIAGNOSIS — H401113 Primary open-angle glaucoma, right eye, severe stage: Secondary | ICD-10-CM | POA: Diagnosis not present

## 2021-11-27 DIAGNOSIS — M79642 Pain in left hand: Secondary | ICD-10-CM | POA: Diagnosis not present

## 2021-12-23 DIAGNOSIS — M79645 Pain in left finger(s): Secondary | ICD-10-CM | POA: Diagnosis not present

## 2021-12-23 DIAGNOSIS — M65312 Trigger thumb, left thumb: Secondary | ICD-10-CM | POA: Diagnosis not present

## 2021-12-30 DIAGNOSIS — H01001 Unspecified blepharitis right upper eyelid: Secondary | ICD-10-CM | POA: Diagnosis not present

## 2021-12-30 DIAGNOSIS — H01004 Unspecified blepharitis left upper eyelid: Secondary | ICD-10-CM | POA: Diagnosis not present

## 2022-02-04 DIAGNOSIS — E785 Hyperlipidemia, unspecified: Secondary | ICD-10-CM | POA: Diagnosis not present

## 2022-02-04 DIAGNOSIS — Z8249 Family history of ischemic heart disease and other diseases of the circulatory system: Secondary | ICD-10-CM | POA: Diagnosis not present

## 2022-02-04 DIAGNOSIS — R7303 Prediabetes: Secondary | ICD-10-CM | POA: Diagnosis not present

## 2022-02-04 DIAGNOSIS — Z809 Family history of malignant neoplasm, unspecified: Secondary | ICD-10-CM | POA: Diagnosis not present

## 2022-02-04 DIAGNOSIS — K219 Gastro-esophageal reflux disease without esophagitis: Secondary | ICD-10-CM | POA: Diagnosis not present

## 2022-02-04 DIAGNOSIS — Z833 Family history of diabetes mellitus: Secondary | ICD-10-CM | POA: Diagnosis not present

## 2022-02-04 DIAGNOSIS — H409 Unspecified glaucoma: Secondary | ICD-10-CM | POA: Diagnosis not present

## 2022-04-08 DIAGNOSIS — H401113 Primary open-angle glaucoma, right eye, severe stage: Secondary | ICD-10-CM | POA: Diagnosis not present

## 2022-04-08 DIAGNOSIS — H04123 Dry eye syndrome of bilateral lacrimal glands: Secondary | ICD-10-CM | POA: Diagnosis not present

## 2022-04-30 DIAGNOSIS — E1169 Type 2 diabetes mellitus with other specified complication: Secondary | ICD-10-CM | POA: Diagnosis not present

## 2022-04-30 DIAGNOSIS — D649 Anemia, unspecified: Secondary | ICD-10-CM | POA: Diagnosis not present

## 2022-04-30 DIAGNOSIS — R7989 Other specified abnormal findings of blood chemistry: Secondary | ICD-10-CM | POA: Diagnosis not present

## 2022-04-30 DIAGNOSIS — E785 Hyperlipidemia, unspecified: Secondary | ICD-10-CM | POA: Diagnosis not present

## 2022-05-07 DIAGNOSIS — Z1331 Encounter for screening for depression: Secondary | ICD-10-CM | POA: Diagnosis not present

## 2022-05-07 DIAGNOSIS — T63444A Toxic effect of venom of bees, undetermined, initial encounter: Secondary | ICD-10-CM | POA: Diagnosis not present

## 2022-05-07 DIAGNOSIS — Z0000 Encounter for general adult medical examination without abnormal findings: Secondary | ICD-10-CM | POA: Diagnosis not present

## 2022-05-07 DIAGNOSIS — E785 Hyperlipidemia, unspecified: Secondary | ICD-10-CM | POA: Diagnosis not present

## 2022-05-07 DIAGNOSIS — D649 Anemia, unspecified: Secondary | ICD-10-CM | POA: Diagnosis not present

## 2022-05-07 DIAGNOSIS — D126 Benign neoplasm of colon, unspecified: Secondary | ICD-10-CM | POA: Diagnosis not present

## 2022-05-07 DIAGNOSIS — R82998 Other abnormal findings in urine: Secondary | ICD-10-CM | POA: Diagnosis not present

## 2022-05-07 DIAGNOSIS — Z1339 Encounter for screening examination for other mental health and behavioral disorders: Secondary | ICD-10-CM | POA: Diagnosis not present

## 2022-05-07 DIAGNOSIS — M179 Osteoarthritis of knee, unspecified: Secondary | ICD-10-CM | POA: Diagnosis not present

## 2022-05-07 DIAGNOSIS — E1169 Type 2 diabetes mellitus with other specified complication: Secondary | ICD-10-CM | POA: Diagnosis not present

## 2022-11-03 DIAGNOSIS — H2513 Age-related nuclear cataract, bilateral: Secondary | ICD-10-CM | POA: Diagnosis not present

## 2022-11-03 DIAGNOSIS — H04123 Dry eye syndrome of bilateral lacrimal glands: Secondary | ICD-10-CM | POA: Diagnosis not present

## 2022-11-03 DIAGNOSIS — R7303 Prediabetes: Secondary | ICD-10-CM | POA: Diagnosis not present

## 2022-11-03 DIAGNOSIS — H524 Presbyopia: Secondary | ICD-10-CM | POA: Diagnosis not present

## 2022-11-03 DIAGNOSIS — H52203 Unspecified astigmatism, bilateral: Secondary | ICD-10-CM | POA: Diagnosis not present

## 2022-11-03 DIAGNOSIS — H5203 Hypermetropia, bilateral: Secondary | ICD-10-CM | POA: Diagnosis not present

## 2022-11-03 DIAGNOSIS — H25013 Cortical age-related cataract, bilateral: Secondary | ICD-10-CM | POA: Diagnosis not present

## 2022-11-03 DIAGNOSIS — H401113 Primary open-angle glaucoma, right eye, severe stage: Secondary | ICD-10-CM | POA: Diagnosis not present

## 2023-01-14 DIAGNOSIS — E1169 Type 2 diabetes mellitus with other specified complication: Secondary | ICD-10-CM | POA: Diagnosis not present

## 2023-01-14 DIAGNOSIS — R030 Elevated blood-pressure reading, without diagnosis of hypertension: Secondary | ICD-10-CM | POA: Diagnosis not present

## 2023-01-14 DIAGNOSIS — E785 Hyperlipidemia, unspecified: Secondary | ICD-10-CM | POA: Diagnosis not present

## 2023-01-14 DIAGNOSIS — R42 Dizziness and giddiness: Secondary | ICD-10-CM | POA: Diagnosis not present

## 2023-01-14 DIAGNOSIS — M179 Osteoarthritis of knee, unspecified: Secondary | ICD-10-CM | POA: Diagnosis not present

## 2023-04-14 ENCOUNTER — Other Ambulatory Visit: Payer: Self-pay | Admitting: Internal Medicine

## 2023-04-14 DIAGNOSIS — Z1231 Encounter for screening mammogram for malignant neoplasm of breast: Secondary | ICD-10-CM

## 2023-05-19 ENCOUNTER — Ambulatory Visit
Admission: RE | Admit: 2023-05-19 | Discharge: 2023-05-19 | Disposition: A | Discharge status: Home / Self Care | Payer: Medicare PPO | Source: Ambulatory Visit | Attending: Internal Medicine | Admitting: Internal Medicine

## 2023-05-19 DIAGNOSIS — Z1231 Encounter for screening mammogram for malignant neoplasm of breast: Secondary | ICD-10-CM

## 2023-07-22 DIAGNOSIS — D649 Anemia, unspecified: Secondary | ICD-10-CM | POA: Diagnosis not present

## 2023-07-22 DIAGNOSIS — Z1212 Encounter for screening for malignant neoplasm of rectum: Secondary | ICD-10-CM | POA: Diagnosis not present

## 2023-07-22 DIAGNOSIS — E1169 Type 2 diabetes mellitus with other specified complication: Secondary | ICD-10-CM | POA: Diagnosis not present

## 2023-07-22 DIAGNOSIS — E785 Hyperlipidemia, unspecified: Secondary | ICD-10-CM | POA: Diagnosis not present

## 2023-07-29 ENCOUNTER — Telehealth: Payer: Self-pay

## 2023-07-29 DIAGNOSIS — K219 Gastro-esophageal reflux disease without esophagitis: Secondary | ICD-10-CM | POA: Diagnosis not present

## 2023-07-29 DIAGNOSIS — E785 Hyperlipidemia, unspecified: Secondary | ICD-10-CM | POA: Diagnosis not present

## 2023-07-29 DIAGNOSIS — M6530 Trigger finger, unspecified finger: Secondary | ICD-10-CM | POA: Diagnosis not present

## 2023-07-29 DIAGNOSIS — E1169 Type 2 diabetes mellitus with other specified complication: Secondary | ICD-10-CM | POA: Diagnosis not present

## 2023-07-29 DIAGNOSIS — K625 Hemorrhage of anus and rectum: Secondary | ICD-10-CM | POA: Diagnosis not present

## 2023-07-29 DIAGNOSIS — R82998 Other abnormal findings in urine: Secondary | ICD-10-CM | POA: Diagnosis not present

## 2023-07-29 DIAGNOSIS — T63444A Toxic effect of venom of bees, undetermined, initial encounter: Secondary | ICD-10-CM | POA: Diagnosis not present

## 2023-07-29 DIAGNOSIS — Z0000 Encounter for general adult medical examination without abnormal findings: Secondary | ICD-10-CM | POA: Diagnosis not present

## 2023-07-29 DIAGNOSIS — M179 Osteoarthritis of knee, unspecified: Secondary | ICD-10-CM | POA: Diagnosis not present

## 2023-07-29 NOTE — Telephone Encounter (Signed)
Patient called requesting a transfer of care. Her last colonoscopy was done in 2018 with Dr. Kinnie Scales. Records will be sent over for review.

## 2023-08-06 ENCOUNTER — Telehealth: Payer: Self-pay | Admitting: Internal Medicine

## 2023-08-06 NOTE — Telephone Encounter (Signed)
Okay to scheduled for a direct colonoscopy in [facility] for history of colon polyps.  EGD 10/15/16: Normal. Z-line at 41 cm  Colonoscopy 10/15/16: Single 10 mm polyp in the transverse colon s/p hot snare. Family history of colon cancer. Repeat colonoscopy in 5 years. No path report available.

## 2023-08-06 NOTE — Telephone Encounter (Signed)
Good morning Dr. Leonides Schanz,   Supervising Provider AM 1.31.25  Patient called requesting to transfer his care over to [facility] for his next colonoscopy. Patient last had a colonoscopy in 2018 with Dr. Kinnie Scales. Patient is requesting to transfer due to Dr. Kinnie Scales retiring. Patient's previous records are in [employer] for you to review and advise on scheduling.   Thank you.

## 2023-08-10 ENCOUNTER — Encounter: Payer: Self-pay | Admitting: Internal Medicine

## 2023-08-10 NOTE — Telephone Encounter (Signed)
 Called patient to schedule, left voicemail.

## 2023-09-02 DIAGNOSIS — H43813 Vitreous degeneration, bilateral: Secondary | ICD-10-CM | POA: Diagnosis not present

## 2023-09-02 DIAGNOSIS — H2513 Age-related nuclear cataract, bilateral: Secondary | ICD-10-CM | POA: Diagnosis not present

## 2023-09-02 DIAGNOSIS — H25013 Cortical age-related cataract, bilateral: Secondary | ICD-10-CM | POA: Diagnosis not present

## 2023-09-02 DIAGNOSIS — H401113 Primary open-angle glaucoma, right eye, severe stage: Secondary | ICD-10-CM | POA: Diagnosis not present

## 2023-09-02 DIAGNOSIS — R7303 Prediabetes: Secondary | ICD-10-CM | POA: Diagnosis not present

## 2023-09-02 DIAGNOSIS — H04123 Dry eye syndrome of bilateral lacrimal glands: Secondary | ICD-10-CM | POA: Diagnosis not present

## 2023-09-09 ENCOUNTER — Telehealth: Payer: Self-pay | Admitting: *Deleted

## 2023-09-09 NOTE — Telephone Encounter (Signed)
 Attempt to reach pt for pre-visit. LM with call back #.  Will attempt to reach again in 5 min due to no other # listed in profile  Second attempt to reach pt for pre-vist unsuccessful. LM with facility # for pt to call back. Instructed pt to call # given by end of the day and reschedule the pre-visit  with RN or the scheduled procedure will be canceled.

## 2023-09-13 ENCOUNTER — Ambulatory Visit (AMBULATORY_SURGERY_CENTER)

## 2023-09-13 VITALS — Ht 66.0 in | Wt 150.0 lb

## 2023-09-13 DIAGNOSIS — Z860100 Personal history of colon polyps, unspecified: Secondary | ICD-10-CM

## 2023-09-13 DIAGNOSIS — Z800 Family history of malignant neoplasm of digestive organs: Secondary | ICD-10-CM

## 2023-09-13 MED ORDER — SUFLAVE 178.7 G PO SOLR: 1.0000 | Freq: Once | ORAL | 0 refills | Status: AC

## 2023-09-13 NOTE — Progress Notes (Signed)

## 2023-09-24 ENCOUNTER — Encounter: Payer: Self-pay | Admitting: Internal Medicine

## 2023-09-30 ENCOUNTER — Encounter: Payer: Self-pay | Admitting: Internal Medicine

## 2023-09-30 ENCOUNTER — Ambulatory Visit: Payer: Medicare PPO | Admitting: Internal Medicine

## 2023-09-30 VITALS — BP 95/62 | HR 69 | Temp 97.0°F | Resp 17 | Ht 66.0 in | Wt 150.0 lb

## 2023-09-30 DIAGNOSIS — Z860100 Personal history of colon polyps, unspecified: Secondary | ICD-10-CM | POA: Diagnosis not present

## 2023-09-30 DIAGNOSIS — Z800 Family history of malignant neoplasm of digestive organs: Secondary | ICD-10-CM

## 2023-09-30 DIAGNOSIS — K648 Other hemorrhoids: Secondary | ICD-10-CM

## 2023-09-30 DIAGNOSIS — Z1211 Encounter for screening for malignant neoplasm of colon: Secondary | ICD-10-CM | POA: Diagnosis not present

## 2023-09-30 DIAGNOSIS — D123 Benign neoplasm of transverse colon: Secondary | ICD-10-CM

## 2023-09-30 MED ORDER — SODIUM CHLORIDE 0.9 % IV SOLN: 500.0000 mL | Freq: Once | INTRAVENOUS | Status: DC

## 2023-09-30 NOTE — Patient Instructions (Signed)

## 2023-09-30 NOTE — Progress Notes (Signed)
 Pt's states no medical or surgical changes since previsit or office visit.

## 2023-09-30 NOTE — Progress Notes (Signed)
 Report to PACU, RN, vss, BBS= Clear.

## 2023-09-30 NOTE — Progress Notes (Signed)
 GASTROENTEROLOGY PROCEDURE H&P NOTE   Primary Care Physician: Rodrigo Ran, MD    Reason for Procedure:   Family history of colon cancer  Plan:    Colonoscopy  Patient is appropriate for endoscopic procedure(s) in the ambulatory ([facility]) setting.  The nature of the procedure, as well as the risks, benefits, and alternatives were carefully and thoroughly reviewed with the patient. Ample time for discussion and questions allowed. The patient understood, was satisfied, and agreed to proceed.     HPI: Diane Dougherty is a 78 y.o. female who presents for a colonoscopy for family history of colon cancer. Denies  changes in bowel habits or unintentional weight loss. She had a scant amount of rectal bleeding due to hemorrhoids a month ago. Mother had colon cancer in her 56s.   Colonoscopy 10/15/16: Single 10 mm polyp in the transverse colon s/p hot snare. Family history of colon cancer. Repeat colonoscopy in 5 years. No path report available.  Past Medical History:  Diagnosis Date   DDD (degenerative disc disease)    GERD (gastroesophageal reflux disease)    H/O: GI bleed 2008   lower   Hyperlipemia    Wears glasses     Past Surgical History:  Procedure Laterality Date   COLONOSCOPY W/ POLYPECTOMY     DILATION AND CURETTAGE OF UTERUS     SHOULDER ARTHROSCOPY WITH SUBACROMIAL DECOMPRESSION, ROTATOR CUFF REPAIR AND BICEP TENDON REPAIR Right 07/12/2013   Procedure: RIGHT SHOULDER ARTHROSCOPY WITH SUBACROMIAL DECOMPRESSION, DEBRIDEMENT, BICEPS TENOTOMY, POSSIBLE ROTATOR CUFF REPAIR;  Surgeon: Cheral Almas, MD;  Location: [facility];  Service: Orthopedics;  Laterality: Right;   TUBAL LIGATION     WISDOM TOOTH EXTRACTION      Prior to Admission medications   Medication Sig Start Date End Date Taking? Authorizing Provider  aspirin EC 81 MG tablet Take 81 mg by mouth every other day.   Yes [provider]  atorvastatin (LIPITOR) 40 MG tablet Take 40  mg by mouth daily.   Yes [provider]  Lactobacillus (PROBIOTIC ACIDOPHILUS PO) Take 1 capsule by mouth daily.   Yes [provider]  dorzolamide-timolol (COSOPT) 2-0.5 % ophthalmic solution Place 1 drop into both eyes 2 (two) times daily.    [provider]  Latanoprost 0.005 % EMUL 1 drop by Does not apply route every morning.    [provider]  mupirocin ointment (BACTROBAN) 2 % Apply 1 Application topically daily as needed.    [provider]  vitamin B-12 (CYANOCOBALAMIN) 100 MCG tablet Take 100 mcg by mouth daily.    [provider]    Current Outpatient Medications  Medication Sig Dispense Refill   aspirin EC 81 MG tablet Take 81 mg by mouth every other day.     atorvastatin (LIPITOR) 40 MG tablet Take 40 mg by mouth daily.     Lactobacillus (PROBIOTIC ACIDOPHILUS PO) Take 1 capsule by mouth daily.     dorzolamide-timolol (COSOPT) 2-0.5 % ophthalmic solution Place 1 drop into both eyes 2 (two) times daily.     Latanoprost 0.005 % EMUL 1 drop by Does not apply route every morning.     mupirocin ointment (BACTROBAN) 2 % Apply 1 Application topically daily as needed.     vitamin B-12 (CYANOCOBALAMIN) 100 MCG tablet Take 100 mcg by mouth daily.     Current Facility-Administered Medications  Medication Dose Route Frequency Provider Last Rate Last Admin   0.9 %  sodium chloride infusion  500  mL Intravenous Once Imogene Burn, MD        Allergies as of 09/30/2023 - Review Complete 09/30/2023  Allergen Reaction Noted   Hydrocodone-acetaminophen Nausea Only 10/10/2009   Morphine Other (See Comments) 10/10/2009   Prednisone Other (See Comments) 12/23/2021    Family History  Problem Relation Age of Onset   Colon cancer Mother    Breast cancer Neg Hx    Rectal cancer Neg Hx    Stomach cancer Neg Hx    Esophageal cancer Neg Hx     Social History   Socioeconomic History   Marital status: Divorced    Spouse name: Not on  file   Number of children: Not on file   Years of education: Not on file   Highest education level: Not on file  Occupational History   Not on file  Tobacco Use   Smoking status: Never   Smokeless tobacco: Never  Vaping Use   Vaping status: Never Used  Substance and Sexual Activity   Alcohol use: Yes    Comment: occ   Drug use: No   Sexual activity: Not on file  Other Topics Concern   Not on file  Social History Narrative   Not on file   Social Drivers of Health   Financial Resource Strain: Not on file  Food Insecurity: Not on file  Transportation Needs: Not on file  Physical Activity: Not on file  Stress: Not on file  Social Connections: Not on file  Intimate Partner Violence: Not on file    Physical Exam: Vital signs in last 24 hours: BP (!) 140/77   Pulse 75   Temp (!) 97 F (36.1 C) (Skin)   Ht 5\' 6"  (1.676 m)   Wt 150 lb (68 kg)   SpO2 99%   BMI 24.21 kg/m  GEN: NAD EYE: Sclerae anicteric ENT: MMM CV: Non-tachycardic Pulm: No increased work of breathing GI: Soft, NT/ND NEURO:  Alert & Oriented   Eulah Pont, MD [facility]  09/30/2023 9:38 AM

## 2023-09-30 NOTE — Op Note (Signed)
  [facility] Patient Name: Diane Dougherty Procedure Date: 09/30/2023 9:37 AM MRN: [id number] Endoscopist: Particia Lather , , [id number] Age: 78 Referring MD:  Date of Birth: Jun 26, 1946 Gender: Female Account #: [id number] Procedure:                Colonoscopy Indications:              Screening patient at increased risk: Family history                            of 1st-degree relative with colorectal cancer at                            age 28 years (or older) Medicines:                Monitored Anesthesia Care Procedure:                Pre-Anesthesia Assessment:                           - Prior to the procedure, a History and Physical                            was performed, and patient medications and                            allergies were reviewed. The patient's tolerance of                            previous anesthesia was also reviewed. The risks                            and benefits of the procedure and the sedation                            options and risks were discussed with the patient.                            All questions were answered, and informed consent                            was obtained. Prior Anticoagulants: The patient has                            taken no anticoagulant or antiplatelet agents. ASA                            Grade Assessment: II - A patient with mild systemic                            disease. After reviewing the risks and benefits,                            the patient was deemed in satisfactory condition to  undergo the procedure.                           After obtaining informed consent, the colonoscope                            was passed under direct vision. Throughout the                            procedure, the patient's blood pressure, pulse, and                            oxygen saturations were monitored continuously. The                            Olympus Scope [id number] was  introduced through the                            anus and advanced to the the terminal ileum. The                            colonoscopy was performed without difficulty. The                            patient tolerated the procedure well. The quality                            of the bowel preparation was excellent. The                            terminal ileum, ileocecal valve, appendiceal                            orifice, and rectum were photographed. Scope In: 9:50:00 AM Scope Out: 10:06:43 AM Scope Withdrawal Time: 0 hours 11 minutes 50 seconds  Total Procedure Duration: 0 hours 16 minutes 43 seconds  Findings:                 The terminal ileum appeared normal.                           Four sessile polyps were found in the transverse                            colon. The polyps were 3 to 6 mm in size. These                            polyps were removed with a cold snare. Resection                            and retrieval were complete.                           Non-bleeding internal hemorrhoids were found during  retroflexion. Complications:            No immediate complications. Estimated Blood Loss:     Estimated blood loss was minimal. Impression:               - The examined portion of the ileum was normal.                           - Four 3 to 6 mm polyps in the transverse colon,                            removed with a cold snare. Resected and retrieved.                           - Non-bleeding internal hemorrhoids. Recommendation:           - Discharge patient to home (with escort).                           - Await pathology results.                           - The findings and recommendations were discussed                            with the patient. Dr Particia Lather "[facility]" [facility],  09/30/2023 10:09:59 AM

## 2023-09-30 NOTE — Progress Notes (Signed)
 Called to room to assist during endoscopic procedure.  Patient ID and intended procedure confirmed with present staff. Received instructions for my participation in the procedure from the performing physician.

## 2023-10-01 ENCOUNTER — Telehealth: Payer: Self-pay

## 2023-10-01 NOTE — Telephone Encounter (Signed)
  Follow up Call-     09/30/2023    9:05 AM  Call back number  Post procedure Call Back phone  # [phone]  Permission to leave phone message Yes     Patient questions:  Do you have a fever, pain , or abdominal swelling? No. Pain Score  0 *  Have you tolerated food without any problems? Yes.    Have you been able to return to your normal activities? Yes.    Do you have any questions about your discharge instructions: Diet   No. Medications  No. Follow up visit  No.  Do you have questions or concerns about your Care? No.  Actions: * If pain score is 4 or above: No action needed, pain <4.

## 2023-10-04 ENCOUNTER — Encounter: Payer: Self-pay | Admitting: Internal Medicine

## 2023-10-04 LAB — SURGICAL PATHOLOGY

## 2024-02-02 DIAGNOSIS — H2513 Age-related nuclear cataract, bilateral: Secondary | ICD-10-CM | POA: Diagnosis not present

## 2024-02-02 DIAGNOSIS — H04123 Dry eye syndrome of bilateral lacrimal glands: Secondary | ICD-10-CM | POA: Diagnosis not present

## 2024-02-02 DIAGNOSIS — R7303 Prediabetes: Secondary | ICD-10-CM | POA: Diagnosis not present

## 2024-02-02 DIAGNOSIS — H401113 Primary open-angle glaucoma, right eye, severe stage: Secondary | ICD-10-CM | POA: Diagnosis not present

## 2024-02-02 DIAGNOSIS — H25013 Cortical age-related cataract, bilateral: Secondary | ICD-10-CM | POA: Diagnosis not present

## 2024-03-03 DIAGNOSIS — H9190 Unspecified hearing loss, unspecified ear: Secondary | ICD-10-CM | POA: Diagnosis not present

## 2024-03-03 DIAGNOSIS — R634 Abnormal weight loss: Secondary | ICD-10-CM | POA: Diagnosis not present

## 2024-03-03 DIAGNOSIS — M179 Osteoarthritis of knee, unspecified: Secondary | ICD-10-CM | POA: Diagnosis not present

## 2024-03-03 DIAGNOSIS — I38 Endocarditis, valve unspecified: Secondary | ICD-10-CM | POA: Diagnosis not present

## 2024-03-03 DIAGNOSIS — E785 Hyperlipidemia, unspecified: Secondary | ICD-10-CM | POA: Diagnosis not present

## 2024-03-03 DIAGNOSIS — F329 Major depressive disorder, single episode, unspecified: Secondary | ICD-10-CM | POA: Diagnosis not present

## 2024-03-03 DIAGNOSIS — E1169 Type 2 diabetes mellitus with other specified complication: Secondary | ICD-10-CM | POA: Diagnosis not present

## 2024-03-03 DIAGNOSIS — K625 Hemorrhage of anus and rectum: Secondary | ICD-10-CM | POA: Diagnosis not present

## 2024-03-07 ENCOUNTER — Other Ambulatory Visit: Payer: Self-pay | Admitting: Internal Medicine

## 2024-03-07 DIAGNOSIS — R632 Polyphagia: Secondary | ICD-10-CM | POA: Diagnosis not present

## 2024-03-07 DIAGNOSIS — E785 Hyperlipidemia, unspecified: Secondary | ICD-10-CM

## 2024-04-13 ENCOUNTER — Other Ambulatory Visit

## 2024-04-19 DIAGNOSIS — R030 Elevated blood-pressure reading, without diagnosis of hypertension: Secondary | ICD-10-CM | POA: Diagnosis not present

## 2024-04-19 DIAGNOSIS — R634 Abnormal weight loss: Secondary | ICD-10-CM | POA: Diagnosis not present

## 2024-04-19 DIAGNOSIS — E1169 Type 2 diabetes mellitus with other specified complication: Secondary | ICD-10-CM | POA: Diagnosis not present

## 2024-04-19 DIAGNOSIS — E785 Hyperlipidemia, unspecified: Secondary | ICD-10-CM | POA: Diagnosis not present

## 2024-04-19 DIAGNOSIS — D649 Anemia, unspecified: Secondary | ICD-10-CM | POA: Diagnosis not present

## 2024-04-19 DIAGNOSIS — G479 Sleep disorder, unspecified: Secondary | ICD-10-CM | POA: Diagnosis not present
